# Patient Record
Sex: Female | Born: 1970 | Race: White | Hispanic: No | Marital: Married | Smoking: Never smoker
Health system: Southern US, Community
[De-identification: ages and names within clinical notes are randomized; demographics above are authoritative.]

## PROBLEM LIST (undated history)

## (undated) DIAGNOSIS — F329 Major depressive disorder, single episode, unspecified: Secondary | ICD-10-CM

## (undated) DIAGNOSIS — T7840XA Allergy, unspecified, initial encounter: Secondary | ICD-10-CM

## (undated) DIAGNOSIS — C801 Malignant (primary) neoplasm, unspecified: Secondary | ICD-10-CM

## (undated) DIAGNOSIS — F32A Depression, unspecified: Secondary | ICD-10-CM

## (undated) HISTORY — PX: TONSILLECTOMY: SUR1361

## (undated) HISTORY — DX: Allergy, unspecified, initial encounter: T78.40XA

## (undated) HISTORY — DX: Major depressive disorder, single episode, unspecified: F32.9

## (undated) HISTORY — PX: EXPLORATORY LAPAROTOMY: SUR591

## (undated) HISTORY — DX: Depression, unspecified: F32.A

## (undated) HISTORY — PX: APPENDECTOMY: SHX54

## (undated) HISTORY — PX: ABDOMINAL HYSTERECTOMY: SHX81

## (undated) HISTORY — PX: RHINOPLASTY: SUR1284

---

## 2008-06-09 ENCOUNTER — Ambulatory Visit: Payer: Self-pay | Admitting: Family Medicine

## 2008-06-09 DIAGNOSIS — M67919 Unspecified disorder of synovium and tendon, unspecified shoulder: Secondary | ICD-10-CM | POA: Insufficient documentation

## 2008-06-09 DIAGNOSIS — M719 Bursopathy, unspecified: Secondary | ICD-10-CM

## 2008-06-09 DIAGNOSIS — Q828 Other specified congenital malformations of skin: Secondary | ICD-10-CM | POA: Insufficient documentation

## 2009-03-12 ENCOUNTER — Telehealth (INDEPENDENT_AMBULATORY_CARE_PROVIDER_SITE_OTHER): Payer: Self-pay | Admitting: *Deleted

## 2009-03-13 ENCOUNTER — Ambulatory Visit: Payer: Self-pay | Admitting: Family Medicine

## 2009-03-13 DIAGNOSIS — K589 Irritable bowel syndrome without diarrhea: Secondary | ICD-10-CM | POA: Insufficient documentation

## 2009-08-03 ENCOUNTER — Encounter: Admission: RE | Admit: 2009-08-03 | Discharge: 2009-08-03 | Payer: Self-pay | Admitting: Family Medicine

## 2009-08-03 ENCOUNTER — Ambulatory Visit: Payer: Self-pay | Admitting: Family Medicine

## 2009-08-03 DIAGNOSIS — R1011 Right upper quadrant pain: Secondary | ICD-10-CM | POA: Insufficient documentation

## 2009-08-03 LAB — CONVERTED CEMR LAB: Casts: NONE SEEN /lpf

## 2009-08-04 ENCOUNTER — Encounter: Payer: Self-pay | Admitting: Family Medicine

## 2009-08-05 LAB — CONVERTED CEMR LAB
Albumin: 4.3 g/dL (ref 3.5–5.2)
Alkaline Phosphatase: 51 units/L (ref 39–117)
BUN: 11 mg/dL (ref 6–23)
Basophils Absolute: 0.1 10*3/uL (ref 0.0–0.1)
Basophils Relative: 1 % (ref 0–1)
Chloride: 102 meq/L (ref 96–112)
Eosinophils Absolute: 0.2 10*3/uL (ref 0.0–0.7)
Eosinophils Relative: 2 % (ref 0–5)
Hemoglobin: 13 g/dL (ref 12.0–15.0)
Lymphocytes Relative: 29 % (ref 12–46)
Lymphs Abs: 2.2 10*3/uL (ref 0.7–4.0)
MCHC: 31.8 g/dL (ref 30.0–36.0)
Monocytes Relative: 9 % (ref 3–12)
Neutro Abs: 4.4 10*3/uL (ref 1.7–7.7)
Neutrophils Relative %: 59 % (ref 43–77)
Platelets: 216 10*3/uL (ref 150–400)
RDW: 13.5 % (ref 11.5–15.5)
Sodium: 137 meq/L (ref 135–145)
WBC: 7.5 10*3/uL (ref 4.0–10.5)

## 2009-08-06 ENCOUNTER — Telehealth: Payer: Self-pay | Admitting: Family Medicine

## 2009-08-06 ENCOUNTER — Encounter: Payer: Self-pay | Admitting: Family Medicine

## 2009-08-06 ENCOUNTER — Encounter: Admission: RE | Admit: 2009-08-06 | Discharge: 2009-08-06 | Payer: Self-pay | Admitting: Family Medicine

## 2009-08-06 DIAGNOSIS — R635 Abnormal weight gain: Secondary | ICD-10-CM | POA: Insufficient documentation

## 2009-08-07 LAB — CONVERTED CEMR LAB
LDL Cholesterol: 126 mg/dL — ABNORMAL HIGH (ref 0–99)
Triglycerides: 67 mg/dL (ref <150)

## 2009-10-08 ENCOUNTER — Telehealth: Payer: Self-pay | Admitting: Family Medicine

## 2009-10-09 ENCOUNTER — Ambulatory Visit: Payer: Self-pay | Admitting: Family Medicine

## 2009-10-09 DIAGNOSIS — J45909 Unspecified asthma, uncomplicated: Secondary | ICD-10-CM | POA: Insufficient documentation

## 2010-02-05 NOTE — Progress Notes (Signed)
Summary: Glass in oatmeal this am  Phone Note Call from Patient Call back at Home Phone [phone]   Caller: Patient Call For: Monique Bars DO Summary of Call: Pt called and states she was eating her Oatmeal this morning and found shards of glass in it. Not hurting but wanted to know if there was anything she should do or just let nature take its course. Please advise Initial call taken by: Kathlene November LPN,  October 08, 2009 8:13 AM  Follow-up for Phone Call        was she eating this in a glass bowl?   more likely to be from her bowl and if not, she should contact manufacturer of the oatmeal to report this.  If she is not having pain or bleeding at this point, she is probably OK.  Be on the lookout for abdominal pain or rectal bleeding for the next 2-3 days.   Follow-up by: Monique Bars DO,  October 08, 2009 8:27 AM  Additional Follow-up for Phone Call Additional follow up Details #1::        Pt notified of MD instructions. Was eating out of glass bowl Additional Follow-up by: Kathlene November LPN,  October 08, 2009 8:33 AM

## 2010-02-05 NOTE — Assessment & Plan Note (Signed)
Summary: IBS   Vital Signs:  Patient profile:   40 year old female Menstrual status:  regular Height:      62.5 inches Weight:      127 pounds BMI:     22.94 O2 Sat:      100 % on Room air Temp:     98.0 degrees F oral Pulse rate:   79 / minute BP sitting:   123 / 82  (left arm) Cuff size:   regular  Vitals Entered By: Payton Spark CMA (March 13, 2009 12:56 PM)  O2 Flow:  Room air CC: IBS flare.    Primary Care Provider:  Seymour Bars DO  CC:  IBS flare. Marland Kitchen  History of Present Illness: 40 yo WF presents for problems with her IBS with constipation predominantly over the last 10 days.  She used to take Zelnorm and Bentyl with success.  She has had a colonoscopy and is on daily progesterone for endometriosis.  She has had previous laparoscopy.  Her pain seems constant and is over the LLQ.  It does not change with eating or position changes.  She has problems with bloating and flatulence.  Denies blood in her stool.  She runs and eats a high fiber diet and drinks lots of water.  Using OTC phillips magnesium which is helping some.  She reports a chronic problem with chronic constipation since childhood and stress does make it worse.  Current Medications (verified): 1)  Lexapro 20 Mg Tabs (Escitalopram Oxalate) .Marland Kitchen.. 1 By Mouth Daily  Allergies (verified): 1)  ! Erythromycin  Past History:  Past Medical History: Reviewed history from 06/09/2008 and no changes required. endometriosis G1P0010, 1 tubal pregnancy, Dr Primitivo Gauze in [place] depression  Past Surgical History: Reviewed history from 06/09/2008 and no changes required. ex lap for endometriosis appy tonsils  Social History: Reviewed history from 06/09/2008 and no changes required. Works for [employer] [place]. Married to [place].  No kids. Never smoked. 4 ETOH/ wk. Runs. Good diet. Condoms for birth control.  Review of Systems      See HPI  Physical Exam  General:  alert, well-developed, well-nourished, and  well-hydrated.   Head:  normocephalic and atraumatic.   Eyes:  sclera non icteric Nose:  no nasal discharge.   Mouth:  pharynx pink and moist.   Neck:  no masses.   Lungs:  Normal respiratory effort, chest expands symmetrically. Lungs are clear to auscultation, no crackles or wheezes. Heart:  Normal rate and regular rhythm. S1 and S2 normal without gallop, murmur, click, rub or other extra sounds. Abdomen:  Bowel sounds positive,abdomen soft and non-tender without masses, organomegaly, + LLQ guarding to deep palpation.   Extremities:  no LE edema Skin:  color normal.   Cervical Nodes:  No lymphadenopathy noted Psych:  good eye contact, not anxious appearing, and not depressed appearing.     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 Treat with Amititza 8 micrograms two times a day with Bentlyl as needed abdominal spasm.  Work on stress reduction and continue high fiber diet, lots of water and regular exercise.  LLQ likely from stool and unlikely to be from adhesions or endometriosis given her history.  Complete Medication List: 1)  Lexapro 20 Mg Tabs (Escitalopram oxalate) .Marland Kitchen.. 1 by mouth daily 2)  Phentermine Hcl 37.5 Mg Caps (Phentermine hcl) .... Take 1 cap by mouth every morning 3)  Amitiza 8 Mcg Caps (Lubiprostone) .Marland Kitchen.. 1 capsule by mouth bid 4)  Dicyclomine Hcl 20 Mg Tabs (Dicyclomine hcl) .Marland Kitchen.. 1 tab by mouth three times a day as needed abdominal spasm  Patient Instructions: 1)  Start on Amitiza samples 2 x a day. 2)  Use Bentyl as needed for abdominal spasm. 3)  Stay on high fiber diet with plenty of water. 4)  Call if any problems. Prescriptions: DICYCLOMINE HCL 20 MG TABS (DICYCLOMINE HCL) 1 tab by mouth three times a day as needed abdominal spasm  #30 x 1   Entered and Authorized by:   Seymour Bars DO   Signed by:   Seymour Bars DO on 03/13/2009   Method used:   Printed then  faxed to ...       [facility] [phone]* (retail)       [address] [place], [place]  [address]       Ph: [phone]       Fax: [phone]   RxID:   [id number] AMITIZA 8 MCG CAPS (LUBIPROSTONE) 1 capsule by mouth bid  #60 x 1   Entered and Authorized by:   Seymour Bars DO   Signed by:   Seymour Bars DO on 03/13/2009   Method used:   Printed then faxed to ...       [facility] [phone]* (retail)       [address] [place], [place]  [address]       Ph: [phone]       Fax: [phone]   RxID:   [id number]

## 2010-02-05 NOTE — Assessment & Plan Note (Signed)
Summary: asthma flare   Vital Signs:  Patient profile:   40 year old female Menstrual status:  regular Height:      62.5 inches Weight:      135 pounds BMI:     24.39 O2 Sat:      100 % on Room air Pulse rate:   64 / minute BP sitting:   129 / 83  (left arm) Cuff size:   regular  Vitals Entered By: Payton Spark CMA (October 09, 2009 8:59 AM)  O2 Flow:  Room air  Serial Vital Signs/Assessments:  Comments: 9:00 AM [facility] [address] By: Payton Spark CMA   CC: Asthma flare while bike riding on Sat.   Primary Care Provider:  Seymour Bars DO  CC:  Asthma flare while bike riding on Sat..  History of Present Illness: 40 yo WF presents for an asthma flare up while bike riding this weekend.  She had chest tightness, wheezing and dry cough.  She did not have an inhaler.  Has been off maintenance meds for a  while.  She has some seaonal allergies but has not taken her Zyrtec.     Current Medications (verified): 1)  Lexapro 20 Mg Tabs (Escitalopram Oxalate) .Marland Kitchen.. 1 By Mouth Daily 2)  Loestrin 1/20 (21) 1-20 Mg-Mcg Tabs (Norethindrone Acet-Ethinyl Est) 3)  Zyrtec Allergy 10 Mg Tabs (Cetirizine Hcl)  Allergies (verified): 1)  ! Erythromycin  Past History:  Past Medical History: endometriosis G1P0010, 1 tubal pregnancy, Dr Primitivo Gauze in [place] depression allergies/ asthma  Past Surgical History: Reviewed history from 06/09/2008 and no changes required. ex lap for endometriosis appy tonsils  Family History: Reviewed history from 06/09/2008 and no changes required. mother obese, T2DM father died Hep C cirrhosis, after liver transplant sister with CP brother drug abuse  Social History: Reviewed history from 06/09/2008 and no changes required. Works for [employer] [place]. Married to [place].  No kids. Never smoked. 4 ETOH/ wk. Runs. Good diet. Condoms for birth control.  Review of Systems      See HPI  Physical Exam  General:  alert, well-developed,  well-nourished, and well-hydrated.   Head:  normocephalic and atraumatic.   Eyes:  conjunctiva clear Ears:  EACs patent; TMs translucent and gray with good cone of light and bony landmarks.  Nose:  nasal congestion Mouth:  o/p pink and moist with cobblestoning Neck:  no masses.   Lungs:  Normal respiratory effort, chest expands symmetrically. Lungs are clear to auscultation, no crackles or wheezes. Heart:  Normal rate and regular rhythm. S1 and S2 normal without gallop, murmur, click, rub or other extra sounds. Skin:  color normal.  no pallor or cyanosis   Impression & Recommendations:  Problem # 1:  ALLERGIC ASTHMA (ICD-493.00)  Asthma flare up with biking in the cold over the weeked with underlying seasonal allergy flare up, sub - optimally treated. Will treat her allergies with daily Claritin D for the next 6 wks.  Add Qvar 1 puff two times a day as controller agent and use ProAir for rescue.  Bring ProAir along with running/ biking activities. Call if any worsening.  Currently lungs/ PFs are normal (but she flares outside). Recheck in 6 wks. Flu shot today.   Her updated medication list for this problem includes:    Qvar 80 Mcg/act Aers (Beclomethasone dipropionate) .Marland Kitchen... 1 puff two times a day, rinse mouth out after each use    Proair Hfa 108 (90 Base) Mcg/act Aers (Albuterol sulfate) .Marland KitchenMarland KitchenMarland KitchenMarland Kitchen  2 puffs q 4 hrs prn  Orders: [facility] (94150)  Complete Medication List: 1)  Lexapro 20 Mg Tabs (Escitalopram oxalate) .Marland Kitchen.. 1 by mouth daily 2)  Loestrin 1/20 (21) 1-20 Mg-mcg Tabs (Norethindrone acet-ethinyl est) 3)  Claritin-d 24 Hour 10-240 Mg Xr24h-tab (Loratadine-pseudoephedrine) .Marland Kitchen.. 1 tab by mouth qam for allergies 4)  Qvar 80 Mcg/act Aers (Beclomethasone dipropionate) .Marland Kitchen.. 1 puff two times a day, rinse mouth out after each use 5)  Proair Hfa 108 (90 Base) Mcg/act Aers (Albuterol sulfate) .... 2 puffs q 4 hrs prn  Other Orders: Admin 1st Vaccine (84132) Flu Vaccine 63yrs +  (44010)  Patient Instructions: 1)  Start Qvar 1 puff two times a day (INHALED STEROID) rinse mouth out after each use.   2)  Use ProAir for rescue inhaler as needed. 3)  Change Zyrtec to Claritin D daily for allergies. 4)  REturn for follow up in 6 wks. Prescriptions: PROAIR HFA 108 (90 BASE) MCG/ACT AERS (ALBUTEROL SULFATE) 2 puffs q 4 hrs prn  #1 x 1   Entered and Authorized by:   Seymour Bars DO   Signed by:   Seymour Bars DO on 10/09/2009   Method used:   Electronically to        [facility] [phone]* (retail)       [address]       [place], [place]  [address]       Ph: [phone]       Fax: [phone]   RxID:   [id number] QVAR 80 MCG/ACT AERS (BECLOMETHASONE DIPROPIONATE) 1 puff two times a day, rinse mouth out after each use  #1 x 3   Entered and Authorized by:   Seymour Bars DO   Signed by:   Seymour Bars DO on 10/09/2009   Method used:   Electronically to        [facility] [phone]* (retail)       [address]       [place], [place]  [address]       Ph: [phone]       Fax: [phone]   RxID:   [id number]  Flu Vaccine Consent Questions     Do you have a history of severe allergic reactions to this vaccine? no    Any prior history of allergic reactions to egg and/or gelatin? no    Do you have a sensitivity to the preservative Thimersol? no    Do you have a past history of Guillan-Barre Syndrome? no    Do you currently have an acute febrile illness? no    Have you ever had a severe reaction to latex? no    Vaccine information given and explained to patient? yes    Are you currently pregnant? no    Lot Number:[id number]   Exp Date:07/06/2010   Site Given  Left Deltoid [place], [place]  [address]       Ph: [phone]       Fax: [phone]   RxID:   [id number]    .lbflu

## 2010-02-05 NOTE — Progress Notes (Signed)
Summary: Lab order  Phone Note Call from Patient   Summary of Call: Pt would like to have complete fasting labs done prior to next OV. Pt is fasting today. Please enter any labs and I will fax order. Initial call taken by: Payton Spark CMA,  August 06, 2009 8:56 AM  Follow-up for Phone Call        order printed. Follow-up by: Seymour Bars DO,  August 06, 2009 9:02 AM  New Problems: SCREENING FOR LIPOID DISORDERS (ICD-V77.91) WEIGHT GAIN (ICD-783.1)   New Problems: SCREENING FOR LIPOID DISORDERS (ICD-V77.91) WEIGHT GAIN (ICD-783.1)  Appended Document: Lab order order faxed

## 2010-02-05 NOTE — Progress Notes (Signed)
Summary: IBS issues  Phone Note Call from Patient Call back at [phone] before 2pm   Caller: Patient Call For: Seymour Bars DO Reason for Call: Talk to Nurse Complaint: Abdominal Pain Summary of Call: Pt having flare up with IBS, pls advise Initial call taken by: Lannette Donath,  March 12, 2009 12:09 PM  Follow-up for Phone Call        Tried to call Pt back but no answer and no machine Follow-up by: Payton Spark CMA,  March 12, 2009 2:27 PM  Additional Follow-up for Phone Call Additional follow up Details #1::        Pt called back. Pt scheduled for tomorrow.  Additional Follow-up by: Payton Spark CMA,  March 12, 2009 2:43 PM

## 2010-02-05 NOTE — Assessment & Plan Note (Signed)
Summary: Rt. side pain   Vital Signs:  Patient profile:   40 year old female Menstrual status:  regular Height:      62.5 inches Weight:      131 pounds Pulse rate:   69 / minute BP sitting:   108 / 74  (left arm) Cuff size:   regular  Vitals Entered By: Avon Gully CMA, Duncan Dull) (August 03, 2009 1:35 PM) CC: rt side pain since last weekend, has become worse started in the rt back and has moved to RUQ,hurts to breath   Primary Care Provider:  Seymour Bars DO  CC:  rt side pain since last weekend, has become worse started in the rt back and has moved to RUQ, and hurts to breath.  History of Present Illness: rt side pain since last weekend, has become worse started in the rt back and has moved to RUQ,hurts to take a deep breath.  Has been traveling for the last weeks so has been carryking alof of luggage. Pain started gradually.  Now pain is in teh RUQ.  More painful with deep breaths and with movements.  Pain is dull.  Occ feels like a tightening sensation. NO nausea. No heartburn. No fever.  No history of kidney stones. No blood in the urine.  Hx of IBS- constipation predominant. No cough. No sweats. Pain is 6/10.    Current Medications (verified): 1)  Lexapro 20 Mg Tabs (Escitalopram Oxalate) .Marland Kitchen.. 1 By Mouth Daily 2)  Phentermine Hcl 37.5 Mg Caps (Phentermine Hcl) .... Take 1 Cap By Mouth Every Morning  Allergies (verified): 1)  ! Erythromycin  Comments:  Nurse/Medical Assistant: The patient's medications and allergies were reviewed with the patient and were updated in the Medication and Allergy Lists. Avon Gully CMA, Duncan Dull) (August 03, 2009 1:36 PM)  Past History:  Past Medical History: Last updated: 06/09/2008 endometriosis G1P0010, 1 tubal pregnancy, Dr Primitivo Gauze in [place] depression  Past Surgical History: Last updated: 06/09/2008 ex lap for endometriosis appy tonsils  Family History: Reviewed history from 06/09/2008 and no changes required. mother obese,  T2DM father died Hep C cirrhosis, after liver transplant sister with CP brother drug abuse  Physical Exam  General:  Well-developed,well-nourished,in no acute distress; alert,appropriate and cooperative throughout examination Mouth:  Oral mucosa and oropharynx without lesions or exudates.  Teeth in good repair. Neck:  No deformities, masses, or tenderness noted. Lungs:  Normal respiratory effort, chest expands symmetrically. Lungs are clear to auscultation, no crackles or wheezes. Heart:  Normal rate and regular rhythm. S1 and S2 normal without gallop, murmur, click, rub or other extra sounds. Abdomen:  Bowel sounds positive,abdomen soft. TEnder over the right upper and lower quadrant. More tender in the upper quad.  Mildy tender overthe epigastrum. No CVA tenderness.    Impression & Recommendations:  Problem # 1:  RUQ PAIN (ICD-789.01)  I am most suspicious of her gallbladder thought she is not having any nausea or GERD or change in BM. Also consider renal stone since did start in hr back. Will check liver as well. Check CBC to rule out acute infection. Will schedule a GB US.  Did have some blood on the UA so will send for culture and micro.  Less likely to be pulmonary.  If results neg consider CXR.  The following medications were removed from the medication list:    Amitiza 8 Mcg Caps (Lubiprostone) .Marland Kitchen... 1 capsule by mouth bid  Orders: T-CBC w/Diff ([id number]) T-Comprehensive Metabolic Panel ([id number]) T-Ultrasound Abdominal, Limited (56433)  T-*Unlisted Diagnostic X-ray test/procedure (40347) UA Dipstick w/o Micro (automated)  (81003)  Complete Medication List: 1)  Lexapro 20 Mg Tabs (Escitalopram oxalate) .Marland Kitchen.. 1 by mouth daily 2)  Phentermine Hcl 37.5 Mg Caps (Phentermine hcl) .... Take 1 cap by mouth every morning  Other Orders: T-Urine Microscopic ([id number]) T-Urine Culture (Spectrum Order) [id number])  Patient Instructions: 1)  We will call with the xray  results adn the lab results. Will try to schedule your ultrasound on Monday.   Laboratory Results   Urine Tests  Date/Time Received: 08/03/09 Date/Time Reported: 7/29//  Routine Urinalysis   Color: yellow Appearance: Clear Glucose: negative   (Normal Range: Negative) Bilirubin: negative   (Normal Range: Negative) Ketone: negative   (Normal Range: Negative) Spec. Gravity: <1.005   (Normal Range: 1.003-1.035) Blood: trace-intact   (Normal Range: Negative) pH: 6.0   (Normal Range: 5.0-8.0) Protein: negative   (Normal Range: Negative) Urobilinogen: 0.2   (Normal Range: 0-1) Nitrite: negative   (Normal Range: Negative) Leukocyte Esterace: negative   (Normal Range: Negative)

## 2010-10-07 ENCOUNTER — Telehealth: Payer: Self-pay | Admitting: Family Medicine

## 2010-10-07 DIAGNOSIS — Z0000 Encounter for general adult medical examination without abnormal findings: Secondary | ICD-10-CM

## 2010-10-07 NOTE — Telephone Encounter (Signed)
:  abs ordered and faxed to [facility]  Pt informed.  Jarvis Newcomer, LPN Domingo Dimes

## 2010-10-07 NOTE — Telephone Encounter (Signed)
Cmp, lipid panel. Dx code V70.0.

## 2010-10-07 NOTE — Telephone Encounter (Signed)
Pt called and said she has CPE scheduled for 10-14-10, and she wishes to go to lab this week for her lab work ahead of time.  Please give orders of what to have drawn on pt.  Thanks. Plan:  Routed to Dr. Linford Arnold. Jarvis Newcomer, LPN Domingo Dimes

## 2010-10-10 LAB — LIPID PANEL
Cholesterol: 237 mg/dL — ABNORMAL HIGH (ref 0–200)
HDL: 71 mg/dL (ref >39)
LDL Cholesterol: 128 mg/dL — ABNORMAL HIGH (ref 0–99)
Total CHOL/HDL Ratio: 3.3 ratio
Triglycerides: 190 mg/dL — ABNORMAL HIGH (ref <150)
VLDL: 38 mg/dL (ref 0–40)

## 2010-10-11 LAB — COMPLETE METABOLIC PANEL WITH GFR
AST: 19 U/L (ref 0–37)
Albumin: 4.2 g/dL (ref 3.5–5.2)
BUN: 13 mg/dL (ref 6–23)
Calcium: 9.2 mg/dL (ref 8.4–10.5)
GFR, Est African American: >60 mL/min (ref >60)
Sodium: 139 mEq/L (ref 135–145)
Total Bilirubin: 0.2 mg/dL — ABNORMAL LOW (ref 0.3–1.2)
Total Protein: 7 g/dL (ref 6.0–8.3)

## 2010-10-13 ENCOUNTER — Encounter: Payer: Self-pay | Admitting: Family Medicine

## 2010-10-14 ENCOUNTER — Ambulatory Visit (INDEPENDENT_AMBULATORY_CARE_PROVIDER_SITE_OTHER): Payer: BC Managed Care – PPO | Admitting: Family Medicine

## 2010-10-14 ENCOUNTER — Encounter: Payer: Self-pay | Admitting: Family Medicine

## 2010-10-14 VITALS — BP 108/73 | HR 74 | Wt 134.0 lb

## 2010-10-14 DIAGNOSIS — K589 Irritable bowel syndrome without diarrhea: Secondary | ICD-10-CM

## 2010-10-14 NOTE — Patient Instructions (Signed)
Keep up your regular exercise program and make sure you are eating a healthy diet Try to eat 4 servings of dairy a day or take a calcium supplement (500mg twice a day). Your vaccines are up to date.   

## 2010-10-14 NOTE — Progress Notes (Signed)
  Subjective:     Monique May is a 40 y.o. female and is here for a comprehensive physical exam. The patient reports no problems.  History   Social History  . Marital Status: Married    Spouse Name: N/A    Number of Children: 0  . Years of Education: N/A   Occupational History  .  [employer] [place]   Social History Main Topics  . Smoking status: Never Smoker   . Smokeless tobacco: Not on file  . Alcohol Use: 1.0 oz/week    2 drink(s) per week     per week  . Drug Use: Not on file  . Sexually Active: Yes -- Female partner(s)    Birth Control/ Protection: Condom     Works for [employer] [place], married, no kids, runs, good diet.   Other Topics Concern  . Not on file   Social History Narrative   Regular exercise.    Health Maintenance  Topic Date Due  . Influenza Vaccine  10/07/2010  . Tetanus/tdap  01/06/2013  . Pap Smear  07/06/2013    The following portions of the patient's history were reviewed and updated as appropriate: allergies, current medications, past family history, past medical history, past social history and past surgical history.  Review of Systems A comprehensive review of systems was negative.   Objective:    BP 108/73  Pulse 74  Wt 134 lb (60.782 kg) General appearance: alert, cooperative and appears stated age Head: Normocephalic, without obvious abnormality, atraumatic Eyes: conjunctiva clear, EOMi. PEERLA Ears: normal TM's and external ear canals both ears Nose: Nares normal. Septum midline. Mucosa normal. No drainage or sinus tenderness. Throat: lips, mucosa, and tongue normal; teeth and gums normal Neck: no adenopathy, no carotid bruit, supple, symmetrical, trachea midline and thyroid not enlarged, symmetric, no tenderness/mass/nodules Back: symmetric, no curvature. ROM normal. No CVA tenderness. Lungs: clear to auscultation bilaterally Heart: regular rate and rhythm, S1, S2 normal, no murmur, click, rub or gallop Abdomen: soft, non-tender; bowel  sounds normal; no masses,  no organomegaly Extremities: extremities normal, atraumatic, no cyanosis or edema Pulses: 2+ and symmetric Skin: Skin color, texture, turgor normal. No rashes or lesions Lymph nodes: cervical and supraclavicalar nodse are norml.  Neurologic: Grossly normal    Assessment:    Healthy female exam.     Plan:     See After Visit Summary for Counseling Recommendations  Start a regular exercise program and make sure you are eating a healthy diet Try to eat 4 servings of dairy a day or take a calcium supplement (500mg  twice a day). Your vaccines are up to date.  Reviewed her lab results.

## 2011-01-09 ENCOUNTER — Ambulatory Visit: Payer: BC Managed Care – PPO | Admitting: Physician Assistant

## 2011-06-10 ENCOUNTER — Encounter: Payer: Self-pay | Admitting: Family Medicine

## 2011-06-10 ENCOUNTER — Other Ambulatory Visit (HOSPITAL_COMMUNITY)
Admission: RE | Admit: 2011-06-10 | Discharge: 2011-06-10 | Disposition: A | Discharge status: Home / Self Care | Payer: BC Managed Care – PPO | Source: Ambulatory Visit | Attending: Family Medicine | Admitting: Family Medicine

## 2011-06-10 ENCOUNTER — Ambulatory Visit (INDEPENDENT_AMBULATORY_CARE_PROVIDER_SITE_OTHER): Payer: BC Managed Care – PPO | Admitting: Family Medicine

## 2011-06-10 VITALS — HR 52 | Ht 64.0 in | Wt 131.0 lb

## 2011-06-10 DIAGNOSIS — Z01419 Encounter for gynecological examination (general) (routine) without abnormal findings: Secondary | ICD-10-CM

## 2011-06-10 DIAGNOSIS — Z1159 Encounter for screening for other viral diseases: Secondary | ICD-10-CM | POA: Insufficient documentation

## 2011-06-10 DIAGNOSIS — Z1231 Encounter for screening mammogram for malignant neoplasm of breast: Secondary | ICD-10-CM

## 2011-06-10 MED ORDER — ALBUTEROL SULFATE HFA 108 (90 BASE) MCG/ACT IN AERS: 2.0000 | INHALATION_SPRAY | Freq: Four times a day (QID) | RESPIRATORY_TRACT | Status: DC | PRN

## 2011-06-10 NOTE — Patient Instructions (Signed)
Start a regular exercise program and make sure you are eating a healthy diet Try to eat 4 servings of dairy a day  Your vaccines are up to date.   

## 2011-06-10 NOTE — Progress Notes (Signed)
  Subjective:     Monique May is a 41 y.o. female and is here for a comprehensive physical exam. The patient reports no problems.  History   Social History  . Marital Status: Married    Spouse Name: N/A    Number of Children: 0  . Years of Education: N/A   Occupational History  . [employer] [place]   Social History Main Topics  . Smoking status: Never Smoker   . Smokeless tobacco: Not on file  . Alcohol Use: 8.4 oz/week    14 Glasses of wine per week  . Drug Use: Not on file  . Sexually Active: Yes -- Female partner(s)    Birth Control/ Protection: Condom     Works for [employer] [place], married, no kids, runs, good diet.   Other Topics Concern  . Not on file   Social History Narrative   Regular exercise.    Health Maintenance  Topic Date Due  . Influenza Vaccine  10/07/2011  . Tetanus/tdap  01/06/2013  . Pap Smear  07/06/2013    The following portions of the patient's history were reviewed and updated as appropriate: allergies, current medications, past family history, past medical history, past social history, past surgical history and problem list.  Review of Systems A comprehensive review of systems was negative.   Objective:    Pulse 52  Ht 5\' 4"  (1.626 m)  Wt 131 lb (59.421 kg)  BMI 22.49 kg/m2  LMP 06/03/2011 General appearance: alert, cooperative and appears stated age Head: Normocephalic, without obvious abnormality, atraumatic Eyes: conj clear, EOMi, PEERLA Ears: normal TM's and external ear canals both ears Nose: Nares normal. Septum midline. Mucosa normal. No drainage or sinus tenderness. Throat: lips, mucosa, and tongue normal; teeth and gums normal Neck: no adenopathy, no carotid bruit, no JVD, supple, symmetrical, trachea midline and thyroid not enlarged, symmetric, no tenderness/mass/nodules Back: symmetric, no curvature. ROM normal. No CVA tenderness. Lungs: clear to auscultation bilaterally Breasts: normal appearance, no masses or  tenderness Heart: regular rate and rhythm, S1, S2 normal, no murmur, click, rub or gallop Abdomen: soft, non-tender; bowel sounds normal; no masses,  no organomegaly Pelvic: cervix normal in appearance, external genitalia normal, no adnexal masses or tenderness, no cervical motion tenderness, rectovaginal septum normal, uterus normal size, shape, and consistency and vagina normal without discharge Extremities: extremities normal, atraumatic, no cyanosis or edema Pulses: 2+ and symmetric Skin: Skin color, texture, turgor normal. No rashes or lesions Lymph nodes: Cervical, supraclavicular, and axillary nodes normal. Neurologic: Grossly normal    Assessment:    Healthy female exam.      Plan:     See After Visit Summary for Counseling Recommendations  Start a regular exercise program and make sure you are eating a healthy diet Try to eat 4 servings of dairy a day or take a calcium supplement (500mg  twice a day). Your vaccines are up to date.  Due for screening labs  And screening cholesterol.

## 2011-07-21 ENCOUNTER — Telehealth: Payer: Self-pay | Admitting: *Deleted

## 2011-07-21 MED ORDER — BUPROPION HCL ER (XL) 150 MG PO TB24: 150.0000 mg | ORAL_TABLET | Freq: Every day | ORAL | Status: DC

## 2011-07-21 NOTE — Telephone Encounter (Signed)
Pt informed and is scheduling f/u appt. 

## 2011-07-21 NOTE — Telephone Encounter (Signed)
We can start it. I will send a rx. Then she need to schedule f/u appt in 3-4 weeks.

## 2011-07-21 NOTE — Telephone Encounter (Signed)
Pt states at her last visit you discussed about her going on Wellbutrin. States she wanted to hold off then but states now she is havign some issues and feels she needs to be on it. Pt would like to know if you can prescribe it since it was discussed or does she need to see you first.

## 2011-07-22 ENCOUNTER — Ambulatory Visit: Payer: BC Managed Care – PPO | Admitting: Family Medicine

## 2011-08-05 ENCOUNTER — Ambulatory Visit (INDEPENDENT_AMBULATORY_CARE_PROVIDER_SITE_OTHER): Payer: PRIVATE HEALTH INSURANCE

## 2011-08-05 DIAGNOSIS — Z1231 Encounter for screening mammogram for malignant neoplasm of breast: Secondary | ICD-10-CM

## 2011-08-11 ENCOUNTER — Ambulatory Visit (INDEPENDENT_AMBULATORY_CARE_PROVIDER_SITE_OTHER): Payer: PRIVATE HEALTH INSURANCE | Admitting: Family Medicine

## 2011-08-11 ENCOUNTER — Encounter: Payer: Self-pay | Admitting: Family Medicine

## 2011-08-11 VITALS — BP 113/69 | HR 104 | Ht 64.0 in | Wt 134.0 lb

## 2011-08-11 DIAGNOSIS — F32A Depression, unspecified: Secondary | ICD-10-CM

## 2011-08-11 DIAGNOSIS — F329 Major depressive disorder, single episode, unspecified: Secondary | ICD-10-CM

## 2011-08-11 DIAGNOSIS — F3289 Other specified depressive episodes: Secondary | ICD-10-CM

## 2011-08-11 DIAGNOSIS — N809 Endometriosis, unspecified: Secondary | ICD-10-CM

## 2011-08-11 MED ORDER — BUPROPION HCL ER (XL) 300 MG PO TB24: 300.0000 mg | ORAL_TABLET | Freq: Every day | ORAL | Status: DC

## 2011-08-11 MED ORDER — NORETHIN-ETH ESTRAD-FE BIPHAS 1 MG-10 MCG / 10 MCG PO TABS: 1.0000 | ORAL_TABLET | Freq: Every day | ORAL | Status: DC

## 2011-08-11 NOTE — Progress Notes (Signed)
  Subjective:    Patient ID: Monique May, female    DOB: 05-13-1970, 41 y.o.   MRN: [id number]  HPI Was on Lexapro for 6 years and then came off but was really irritable.  We started the Wellbutrin and dong well on it.  Says thinks we should increase her dose. Has been butting out alcohol and has been working out.  She doesn't want it to affect her marriage.  She is sleeping well.  She want sot restart her birth control.     Review of Systems     Objective:   Physical Exam  Constitutional: She is oriented to person, place, and time. She appears well-developed and well-nourished.  HENT:  Head: Normocephalic and atraumatic.  Cardiovascular: Normal rate, regular rhythm and normal heart sounds.   Pulmonary/Chest: Effort normal and breath sounds normal.  Neurological: She is alert and oriented to person, place, and time.  Skin: Skin is warm and dry.  Psychiatric: She has a normal mood and affect. Her behavior is normal.          Assessment & Plan:  Depression-I. do think she's doing better on the Wellbutrin side effect why she's been tolerating it very well. We will increase to 300 mg and see how she does. Followup in 6 weeks. We can always go back to Lexapro or we can always change to a different SSRI if needed but I'm hoping that 3 mg dose will help. She can call sooner if she's having any side effects or concerns with it. No chest pain or shortness of breath. She sleeping well. No sexual side effects.  Endometriosis-she would like to restart her birth control pills. I will send over a new prescription to the pharmacy. She usually takes them continuously so she doesn't have a period which really helps her. She's having a getting back on the hormone will also help regulate her mood.

## 2011-08-12 LAB — COMPLETE METABOLIC PANEL WITH GFR
ALT: 14 U/L (ref 0–35)
Albumin: 4.2 g/dL (ref 3.5–5.2)
Alkaline Phosphatase: 48 U/L (ref 39–117)
CO2: 27 mEq/L (ref 19–32)
Calcium: 9.4 mg/dL (ref 8.4–10.5)
Chloride: 105 mEq/L (ref 96–112)
GFR, Est African American: >89 mL/min
Glucose, Bld: 90 mg/dL (ref 70–99)
Potassium: 4.5 mEq/L (ref 3.5–5.3)
Total Bilirubin: 0.3 mg/dL (ref 0.3–1.2)
Total Protein: 6.6 g/dL (ref 6.0–8.3)

## 2011-08-12 LAB — LIPID PANEL
Cholesterol: 186 mg/dL (ref 0–200)
Total CHOL/HDL Ratio: 3 Ratio

## 2011-09-22 ENCOUNTER — Ambulatory Visit (INDEPENDENT_AMBULATORY_CARE_PROVIDER_SITE_OTHER): Payer: PRIVATE HEALTH INSURANCE | Admitting: Family Medicine

## 2011-09-22 ENCOUNTER — Encounter: Payer: Self-pay | Admitting: Family Medicine

## 2011-09-22 VITALS — BP 118/76 | HR 69 | Wt 131.0 lb

## 2011-09-22 DIAGNOSIS — F32A Depression, unspecified: Secondary | ICD-10-CM

## 2011-09-22 DIAGNOSIS — F3289 Other specified depressive episodes: Secondary | ICD-10-CM

## 2011-09-22 DIAGNOSIS — F329 Major depressive disorder, single episode, unspecified: Secondary | ICD-10-CM

## 2011-09-22 NOTE — Progress Notes (Signed)
  Subjective:    Patient ID: Monique May, female    DOB: [birth year], 41 y.o.   MRN: [id number]  HPI Here to f/u depression. Don very well. Sleeping well. No SExual side effects.  toleating meds well.  On wellbutrin 300 daily. Off the lexapro.    Review of Systems     Objective:   Physical Exam  Constitutional: She is oriented to person, place, and time. She appears well-developed and well-nourished.  HENT:  Head: Normocephalic and atraumatic.  Cardiovascular: Normal rate, regular rhythm and normal heart sounds.   Pulmonary/Chest: Effort normal and breath sounds normal.  Neurological: She is alert and oriented to person, place, and time.  Skin: Skin is warm and dry.  Psychiatric: She has a normal mood and affect. Her behavior is normal.          Assessment & Plan:  Depression - PH-9 score of 0. Dong very well. At goal therapeutically. F/U in 4 months. She has cut back on her alcohol intake which wonderful.   \Declined flu shot.

## 2011-11-23 ENCOUNTER — Other Ambulatory Visit: Payer: Self-pay | Admitting: Family Medicine

## 2011-11-24 ENCOUNTER — Ambulatory Visit (INDEPENDENT_AMBULATORY_CARE_PROVIDER_SITE_OTHER): Payer: PRIVATE HEALTH INSURANCE | Admitting: Family Medicine

## 2011-11-24 VITALS — BP 122/77 | HR 85 | Temp 98.3°F | Wt 130.0 lb

## 2011-11-24 DIAGNOSIS — F329 Major depressive disorder, single episode, unspecified: Secondary | ICD-10-CM | POA: Insufficient documentation

## 2011-11-24 DIAGNOSIS — F3289 Other specified depressive episodes: Secondary | ICD-10-CM

## 2011-11-24 DIAGNOSIS — F32A Depression, unspecified: Secondary | ICD-10-CM | POA: Insufficient documentation

## 2011-11-24 DIAGNOSIS — K589 Irritable bowel syndrome without diarrhea: Secondary | ICD-10-CM

## 2011-11-24 MED ORDER — BUPROPION HCL ER (XL) 300 MG PO TB24: 300.0000 mg | ORAL_TABLET | Freq: Every day | ORAL | Status: DC

## 2011-11-24 MED ORDER — AMBULATORY NON FORMULARY MEDICATION: Status: AC

## 2011-11-24 NOTE — Patient Instructions (Addendum)
Psyllium powder: 1 heaping tablespoon (3.4g) daily for one week, if symptoms persist increase to 2 heaping spoonfuls daily for one week, if symptoms persist increase to 3 heaping tablespoons daily but no more.

## 2011-11-24 NOTE — Progress Notes (Signed)
CC: Monique May is a 41 y.o. female is here for abdominal discomfort   Subjective: HPI:  Patient describes 3 months of abdominal pain is described as a cramping and twisting central low in her pelvis is present right before and during defecation. She has occasional loose stools but overall stools are hard and she feels somewhat constipated. Pain is worse during defecation and is immediately relieved after defecation. Symptoms are present most days of the week. She has a bowel movement every one to 3 days. Interventions have included an herbal aloe preparation and a herbal chlorophyll preparation, neither have helped much. She denies nocturnal symptoms, unintentional weight loss, bloody nor tar-like stools. The pain is nonradiating. It is occasionally associated with nausea but there's been no vomiting. This has interfered with her sex life, she's not sure if pain is reproduced with sex. Pain is not reproduced with menses. Stress levels have increased due to her stepson moving nearby and about to have a baby without much money. This in switching off of Lexapro coincided with the deterioration of her symptoms. She denies fevers, chills, right upper quadrant pain, epigastric pain.  Carries a diagnosis of depression and currently takes Wellbutrin. She is requesting a refill. She states her mood is stable and denies depression or anxiety currently but does feel somewhat down regarding her persistent abdominal symptoms. She plans to followup with Dr. Judie Petit. in January for routine depression followup.   Review Of Systems Outlined In HPI  Past Medical History  Diagnosis Date  . Endometriosis   . Tubal pregnancy   . Depression   . Allergy   . Asthma      Family History  Problem Relation Age of Onset  . Obesity Mother   . Diabetes Mother   . Cirrhosis Father     Hep C, deceased after liver transplant  . Cerebral palsy Sister   . Drug abuse Brother   . Skin cancer Mother   . Hyperlipidemia Mother     . Psychosis Sister   . Bipolar disorder Brother      History  Substance Use Topics  . Smoking status: Never Smoker   . Smokeless tobacco: Not on file  . Alcohol Use: 8.4 oz/week    14 Glasses of wine per week     Objective: Filed Vitals:   11/24/11 0845  BP: 122/77  Pulse: 85  Temp: 98.3 F (36.8 C)    General: Alert and Oriented, No Acute Distress HEENT: Pupils equal, round, reactive to light. Conjunctivae clear.   Moist mucous membranes, pharynx without inflammation nor lesions.   Lungs: Clear to auscultation bilaterally, no wheezing/ronchi/rales.  Comfortable work of breathing. Good air movement. Cardiac: Regular rate and rhythm. Normal S1/S2.  No murmurs, rubs, nor gallops.   Abdomen: Normal bowel sounds, soft and non tender without palpable masses. No rebound tenderness, no right upper quadrant pain  Mental Status: No depression, anxiety, nor agitation. logical and normal thought process Skin: Warm and dry.  Assessment & Plan: Aedan was seen today for abdominal discomfort.  Diagnoses and associated orders for this visit:  Ibs (irritable bowel syndrome) - AMBULATORY NON FORMULARY MEDICATION; Psyllium Product: 3.4g Once a Day  Depression - buPROPion (WELLBUTRIN XL) 300 MG 24 hr tablet; Take 1 tablet (300 mg total) by mouth daily.     she meets for criteria for IBS, constipation predominant. No red flags. I like her to start psyllium 3.4 g daily and will titrate as needed no more than 3 times this  dose in a 24-hour period. Discussed possibility of going back on Lexapro or starting lubiprostone if this does not improve her symptoms. I've asked her to follow up with myself or Dr. Judie Petit in 2-3 weeks. Life stressors may be making her IBS deteriorate. Depression appears to be stable continue Wellbutrin until followup with Dr. Judie Petit.  Return in about 2 weeks (around 12/08/2011).

## 2011-11-25 ENCOUNTER — Telehealth: Payer: Self-pay | Admitting: *Deleted

## 2011-11-25 NOTE — Telephone Encounter (Signed)
Lexapro is one of many medications that has some evidence of helping prevent IBS, however taking this along with bupropion can cause serious side effects since they work somewhat similarly.  When it comes to starting or stopping mood medications like bupropion or lexapro our partners here typically prefer to have the PCP manage that since they know the patient's background a little better.  Please schedule a visit with Dr. Judie Petit for consideration of changing these medications.

## 2011-11-25 NOTE — Telephone Encounter (Signed)
Pt calls and states seen you testerday and you all had discussed that Lexapro can help with symptoms and flares with IBS. Pt would like to know how this works and have some sent to her pharmacy. Please advise

## 2011-11-25 NOTE — Telephone Encounter (Signed)
Pt.notified

## 2011-11-27 ENCOUNTER — Ambulatory Visit (INDEPENDENT_AMBULATORY_CARE_PROVIDER_SITE_OTHER): Payer: PRIVATE HEALTH INSURANCE | Admitting: Family Medicine

## 2011-11-27 ENCOUNTER — Encounter: Payer: Self-pay | Admitting: Family Medicine

## 2011-11-27 VITALS — BP 108/74 | HR 80 | Ht 64.0 in | Wt 129.0 lb

## 2011-11-27 DIAGNOSIS — K589 Irritable bowel syndrome without diarrhea: Secondary | ICD-10-CM

## 2011-11-27 MED ORDER — LUBIPROSTONE 8 MCG PO CAPS: 8.0000 ug | ORAL_CAPSULE | Freq: Two times a day (BID) | ORAL | Status: DC

## 2011-11-27 NOTE — Progress Notes (Signed)
  Subjective:    Patient ID: Monique May, female    DOB: Aug 01, 1970, 41 y.o.   MRN: [id number]  HPI F/U IBS with IBS, constipation predominant - Says she doesn't know what to eat or do. She has started the psyllium that Dr. Margo May had recommended. She is seen him for these symptoms on Monday. She says it really hasn't taken much effect yet but knows that it will take several days to start working. She went online and read a lot of different information. She's been trying to avoid dairy as well gluten free. She does feel little bit better since stopping gluten. She does not have a formal diagnosis gluten intolerance. She is still struggling with constipation. Is affecting her quality of life as well as her sex life. She is at a point that normally she prefers a natural regimen but wants relief. Dr. Ivan May had discussed with her possibility of a prescription medication and she would like to try it.   Review of Systems     Objective:   Physical Exam  Constitutional: She is oriented to person, place, and time. She appears well-developed and well-nourished.  HENT:  Head: Normocephalic and atraumatic.  Cardiovascular: Normal rate, regular rhythm and normal heart sounds.   Pulmonary/Chest: Effort normal and breath sounds normal.  Abdominal: Soft. Bowel sounds are normal. She exhibits no distension and no mass. There is no tenderness. There is no rebound and no guarding.  Neurological: She is alert and oriented to person, place, and time.  Skin: Skin is warm and dry.  Psychiatric: She has a normal mood and affect. Her behavior is normal.          Assessment & Plan:  IBS - we will start with Amitiza.  It has been on the market a little bit longer; we will see if she tolerates this well. Certainly we can switch medications if there is a preference with her insurance company. I did give her samples to start and sent a prescription to the pharmacy. Hopefully she can activate a coupon card and this will  make it fairly affordable for her. Call if not getting significant relief within one to 2 weeks. She should get initial relief within 3 days anesthetist expectation for her. We also discussed potential side effects. We also discussed working on stress levels this is often times aggravate her irritable bowel. We could also consider switching her back to Lexapro. Her symptoms do get worse with her IBS after we took her off Lexapro and put her on Wellbutrin. There as far as her depression is concerned she is well-controlled on Wellbutrin.

## 2011-12-05 ENCOUNTER — Emergency Department (INDEPENDENT_AMBULATORY_CARE_PROVIDER_SITE_OTHER)
Admission: EM | Admit: 2011-12-05 | Discharge: 2011-12-05 | Disposition: A | Discharge status: Home / Self Care | Payer: PRIVATE HEALTH INSURANCE | Source: Home / Self Care | Attending: Family Medicine | Admitting: Family Medicine

## 2011-12-05 DIAGNOSIS — J069 Acute upper respiratory infection, unspecified: Secondary | ICD-10-CM

## 2011-12-05 MED ORDER — PREDNISONE 20 MG PO TABS: 20.0000 mg | ORAL_TABLET | Freq: Two times a day (BID) | ORAL | Status: DC

## 2011-12-05 MED ORDER — AZITHROMYCIN 250 MG PO TABS: ORAL_TABLET | ORAL | Status: DC

## 2011-12-05 MED ORDER — BENZONATATE 200 MG PO CAPS: 200.0000 mg | ORAL_CAPSULE | Freq: Every day | ORAL | Status: DC

## 2011-12-05 NOTE — ED Provider Notes (Signed)
History     CSN: [id number]  Arrival date & time 12/05/11  1545   First MD Initiated Contact with Patient 12/05/11 1644      Chief Complaint  Patient presents with  . Cough    x 4 days  . Nasal Congestion    x 4 days      HPI Comments: Patient complains of approximately 4 day history of gradually progressive URI symptoms beginning with a mild sore throat (now improved), followed by progressive nasal congestion and hoarseness.  A cough started yesterday.  Complains of fatigue but no myalgias.  Cough is now worse at night and generally productive during the day.  There has been no pleuritic pain or wheezes, but she does feel shortness of breath at times.  She has a history of mild asthma but normally only needs to use her albuterol inhaler about 3 to 4 times per month. She had pneumonia about 10 to 15 years ago.  The history is provided by the patient.    Past Medical History  Diagnosis Date  . Endometriosis   . Tubal pregnancy   . Depression   . Allergy   . Asthma     Past Surgical History  Procedure Date  . Exploratory lap     endometriosis  . Appendectomy   . Tonsillectomy   . Rhinoplasty     Family History  Problem Relation Age of Onset  . Obesity Mother   . Diabetes Mother   . Cirrhosis Father     Hep C, deceased after liver transplant  . Cerebral palsy Sister   . Drug abuse Brother   . Skin cancer Mother   . Hyperlipidemia Mother   . Psychosis Sister   . Bipolar disorder Brother     History  Substance Use Topics  . Smoking status: Never Smoker   . Smokeless tobacco: Not on file  . Alcohol Use: 8.4 oz/week    14 Glasses of wine per week    OB History    Grav Para Term Preterm Abortions TAB SAB Ect Mult Living                  Review of Systems + sore throat + cough No pleuritic pain No wheezing + nasal congestion + post-nasal drainage No sinus pain/pressure No itchy/red eyes No earache No hemoptysis + SOB + fever, + chills +  nausea No vomiting + abdominal pain (history of IBS) No diarrhea No urinary symptoms No skin rashes + fatigue No myalgias + headache Used OTC meds without relief  Allergies  Erythromycin  Home Medications   Current Outpatient Rx  Name  Route  Sig  Dispense  Refill  . ALBUTEROL SULFATE HFA 108 (90 BASE) MCG/ACT IN AERS   Inhalation   Inhale 2 puffs into the lungs every 6 (six) hours as needed.   1 Inhaler   1   . AMBULATORY NON FORMULARY MEDICATION      Psyllium Product: 3.4g Once a Day   100 g   0   . BUPROPION HCL ER (XL) 300 MG PO TB24   Oral   Take 1 tablet (300 mg total) by mouth daily.   30 tablet   1   . LUBIPROSTONE 8 MCG PO CAPS   Oral   Take 1 capsule (8 mcg total) by mouth 2 (two) times daily with a meal.   60 capsule   1   . NORETHIN-ETH ESTRAD-FE BIPHAS 1 MG-10 MCG / 10 MCG  PO TABS   Oral   Take 1 tablet by mouth daily.   1 Package   12   . ALBUTEROL SULFATE (2.5 MG/3ML) 0.083% IN NEBU   Nebulization   Take 3 mLs (2.5 mg total) by nebulization every 6 (six) hours as needed for wheezing. Max 4 doses per day   75 mL   0   . AZITHROMYCIN 250 MG PO TABS      Take 2 tabs today; then begin one tab once daily for 4 more days. (Rx void after 12/13/11)   6 each   0   . BENZONATATE 200 MG PO CAPS   Oral   Take 1 capsule (200 mg total) by mouth at bedtime. Take as needed for cough   12 capsule   0   . BUDESONIDE 90 MCG/ACT IN AEPB   Inhalation   Inhale 2 puffs into the lungs 2 (two) times daily.   1 Inhaler   0   . GUAIFENESIN-CODEINE 100-10 MG/5ML PO SYRP   Oral   Take 10 mLs by mouth at bedtime. for cough   120 mL   0   . PREDNISONE 20 MG PO TABS   Oral   Take 1 tablet (20 mg total) by mouth 2 (two) times daily.   10 tablet   0     BP 131/82  Pulse 95  Temp 99 F (37.2 C) (Oral)  Resp 18  Ht 5\' 3"  (1.6 m)  Wt 130 lb (58.968 kg)  BMI 23.03 kg/m2  SpO2 100%  Physical Exam Nursing notes and Vital Signs  reviewed. Appearance:  Patient appears healthy, stated age, and in no acute distress Eyes:  Pupils are equal, round, and reactive to light and accomodation.  Extraocular movement is intact.  Conjunctivae are not inflamed  Ears:  Canals normal.  Tympanic membranes normal.  Nose:  Mildly congested turbinates.  No sinus tenderness.   Pharynx:  Normal Neck:  Supple.  Slightly tender shotty posterior nodes are palpated bilaterally  Lungs:  Clear to auscultation.  Breath sounds are equal.  Heart:  Regular rate and rhythm without murmurs, rubs, or gallops.  Abdomen:  Nontender without masses or hepatosplenomegaly.  Bowel sounds are present.  No CVA or flank tenderness.  Extremities:  No edema.  No calf tenderness Skin:  No rash present.   ED Course  Procedures  none      1. Acute upper respiratory infections of unspecified site       MDM  There is no evidence of bacterial infection today.   Treat symptomatically for now:  Begin prednisone burst.  Prescription written for Benzonatate (Tessalon) to take at bedtime for night-time cough.  Take Mucinex D (guaifenesin with decongestant) twice daily for congestion.  Increase fluid intake, rest. May use Afrin nasal spray (or generic oxymetazoline) twice daily for about 5 days.  Also recommend using saline nasal spray several times daily and saline nasal irrigation (AYR is a common brand) Stop all antihistamines for now, and other non-prescription cough/cold preparations. Continue Ventolin inhaler as needed Begin Azithromycin if not improving about 5 days or if persistent fever develops (Given a prescription to hold, with an expiration date)  Follow-up with family doctor if not improving 7 to 10 days.         Lattie Haw, MD 12/08/11 [phone]

## 2011-12-05 NOTE — ED Notes (Signed)
Monique May complains of productive cough with yellow sputum and nasal congestion for 4 days. She has had a low grade fever, sneezing and hoarseness.

## 2011-12-07 ENCOUNTER — Emergency Department (INDEPENDENT_AMBULATORY_CARE_PROVIDER_SITE_OTHER)
Admission: EM | Admit: 2011-12-07 | Discharge: 2011-12-07 | Disposition: A | Discharge status: Home / Self Care | Payer: PRIVATE HEALTH INSURANCE | Source: Home / Self Care | Attending: Family Medicine | Admitting: Family Medicine

## 2011-12-07 ENCOUNTER — Emergency Department (INDEPENDENT_AMBULATORY_CARE_PROVIDER_SITE_OTHER): Payer: PRIVATE HEALTH INSURANCE

## 2011-12-07 DIAGNOSIS — R059 Cough, unspecified: Secondary | ICD-10-CM

## 2011-12-07 DIAGNOSIS — J209 Acute bronchitis, unspecified: Secondary | ICD-10-CM

## 2011-12-07 DIAGNOSIS — Z8701 Personal history of pneumonia (recurrent): Secondary | ICD-10-CM

## 2011-12-07 DIAGNOSIS — R05 Cough: Secondary | ICD-10-CM

## 2011-12-07 MED ORDER — ALBUTEROL SULFATE (2.5 MG/3ML) 0.083% IN NEBU: 2.5000 mg | INHALATION_SOLUTION | Freq: Four times a day (QID) | RESPIRATORY_TRACT | Status: DC | PRN

## 2011-12-07 MED ORDER — BUDESONIDE 90 MCG/ACT IN AEPB: 2.0000 | INHALATION_SPRAY | Freq: Two times a day (BID) | RESPIRATORY_TRACT | Status: DC

## 2011-12-07 MED ORDER — GUAIFENESIN-CODEINE 100-10 MG/5ML PO SYRP: 10.0000 mL | ORAL_SOLUTION | Freq: Every day | ORAL | Status: DC

## 2011-12-07 NOTE — ED Notes (Signed)
Seen here Friday for cough, unable to get sleep d/t cough.  Taking prednisone and Mucinex- D, did not start ABT yet.

## 2011-12-07 NOTE — ED Provider Notes (Signed)
History     CSN: [id number]  Arrival date & time 12/07/11  1102   First MD Initiated Contact with Patient 12/07/11 1119      Chief complaint:  Persistent cough     HPI Comments: Patient reports that she has started prednisone, but still has a persistent non-productive cough, worse at night.  She often coughs until she gags.  She has persistent low grade fever to 100.4.  She has not had tetanus immunization for about 7 years.  No pleuritic pain or shortness of breath, but she continues to wheeze.  Albuterol MDI has been somewhat helpful.  She states that she has a nebulizer at home and would like Rx for albuterol.  The history is provided by the patient.    Past Medical History  Diagnosis Date  . Endometriosis   . Tubal pregnancy   . Depression   . Allergy   . Asthma     Past Surgical History  Procedure Date  . Exploratory lap     endometriosis  . Appendectomy   . Tonsillectomy   . Rhinoplasty     Family History  Problem Relation Age of Onset  . Obesity Mother   . Diabetes Mother   . Cirrhosis Father     Hep C, deceased after liver transplant  . Cerebral palsy Sister   . Drug abuse Brother   . Skin cancer Mother   . Hyperlipidemia Mother   . Psychosis Sister   . Bipolar disorder Brother     History  Substance Use Topics  . Smoking status: Never Smoker   . Smokeless tobacco: Not on file  . Alcohol Use: 8.4 oz/week    14 Glasses of wine per week    OB History    Grav Para Term Preterm Abortions TAB SAB Ect Mult Living                  Review of Systems + sore throat + cough No pleuritic pain No wheezing + nasal congestion + post-nasal drainage No sinus pain/pressure No itchy/red eyes No earache No hemoptysis No SOB + fever, + chills + nausea No vomiting, but occasionally gags when coughing + abdominal pain + diarrhea (IBS) No urinary symptoms No skin rashes + fatigue No myalgias No headache Used OTC meds without relief  Allergies    Erythromycin  Home Medications   Current Outpatient Rx  Name  Route  Sig  Dispense  Refill  . ALBUTEROL SULFATE HFA 108 (90 BASE) MCG/ACT IN AERS   Inhalation   Inhale 2 puffs into the lungs every 6 (six) hours as needed.   1 Inhaler   1   . ALBUTEROL SULFATE (2.5 MG/3ML) 0.083% IN NEBU   Nebulization   Take 3 mLs (2.5 mg total) by nebulization every 6 (six) hours as needed for wheezing. Max 4 doses per day   75 mL   0   . AMBULATORY NON FORMULARY MEDICATION      Psyllium Product: 3.4g Once a Day   100 g   0   . AZITHROMYCIN 250 MG PO TABS      Take 2 tabs today; then begin one tab once daily for 4 more days. (Rx void after 12/13/11)   6 each   0   . BENZONATATE 200 MG PO CAPS   Oral   Take 1 capsule (200 mg total) by mouth at bedtime. Take as needed for cough   12 capsule   0   .  BUDESONIDE 90 MCG/ACT IN AEPB   Inhalation   Inhale 2 puffs into the lungs 2 (two) times daily.   1 Inhaler   0   . BUPROPION HCL ER (XL) 300 MG PO TB24   Oral   Take 1 tablet (300 mg total) by mouth daily.   30 tablet   1   . GUAIFENESIN-CODEINE 100-10 MG/5ML PO SYRP   Oral   Take 10 mLs by mouth at bedtime. for cough   120 mL   0   . LUBIPROSTONE 8 MCG PO CAPS   Oral   Take 1 capsule (8 mcg total) by mouth 2 (two) times daily with a meal.   60 capsule   1   . NORETHIN-ETH ESTRAD-FE BIPHAS 1 MG-10 MCG / 10 MCG PO TABS   Oral   Take 1 tablet by mouth daily.   1 Package   12   . PREDNISONE 20 MG PO TABS   Oral   Take 1 tablet (20 mg total) by mouth 2 (two) times daily.   10 tablet   0     BP 137/82  Pulse 93  Temp 98.4 F (36.9 C) (Oral)  Resp 18  Ht 5\' 3"  (1.6 m)  Wt 128 lb (58.06 kg)  BMI 22.67 kg/m2  SpO2 100%  Physical Exam Nursing notes and Vital Signs reviewed. Appearance:  Patient appears healthy, stated age, and in no acute distress Eyes:  Pupils are equal, round, and reactive to light and accomodation.  Extraocular movement is intact.   Conjunctivae are not inflamed  Ears:  Canals normal.  Tympanic membranes normal.  Nose:  Mildly congested turbinates.  No sinus tenderness.  Pharynx:  Normal Neck:  Supple.  Slightly tender shotty posterior nodes are palpated bilaterally  Lungs:  Clear to auscultation.  Breath sounds are equal.  Heart:  Regular rate and rhythm without murmurs, rubs, or gallops.  Abdomen:  Nontender without masses or hepatosplenomegaly.  Bowel sounds are present.  No CVA or flank tenderness.  Extremities:  No edema.  No calf tenderness Skin:  No rash present.   ED Course  Procedures none   Dg Chest 2 View  12/07/2011  *RADIOLOGY REPORT*  Clinical Data: Persistent cough.  CHEST - 2 VIEW  Comparison: None.  Findings: Heart size and pulmonary vascularity are normal and the lungs are clear.  No significant osseous abnormality.  IMPRESSION: Normal chest.   Original Report Authenticated By: Francene Boyers, M.D.      1. Acute bronchitis    ? pertussis   MDM  Add albuterol by nebulizer.  Add Pulmicort. Begin Azithromycin.  Continue prednisone.  Continue Mucinex. Recommend a Tdap when well.        Lattie Haw, MD 12/08/11 [phone]

## 2011-12-08 ENCOUNTER — Ambulatory Visit: Payer: PRIVATE HEALTH INSURANCE | Admitting: Family Medicine

## 2011-12-09 ENCOUNTER — Telehealth: Payer: Self-pay | Admitting: *Deleted

## 2011-12-25 ENCOUNTER — Ambulatory Visit (INDEPENDENT_AMBULATORY_CARE_PROVIDER_SITE_OTHER): Payer: PRIVATE HEALTH INSURANCE | Admitting: Family Medicine

## 2011-12-25 ENCOUNTER — Encounter: Payer: Self-pay | Admitting: Family Medicine

## 2011-12-25 VITALS — BP 122/85 | HR 76 | Ht 63.0 in | Wt 128.0 lb

## 2011-12-25 DIAGNOSIS — F3289 Other specified depressive episodes: Secondary | ICD-10-CM

## 2011-12-25 DIAGNOSIS — F32A Depression, unspecified: Secondary | ICD-10-CM

## 2011-12-25 DIAGNOSIS — F329 Major depressive disorder, single episode, unspecified: Secondary | ICD-10-CM

## 2011-12-25 DIAGNOSIS — Z0000 Encounter for general adult medical examination without abnormal findings: Secondary | ICD-10-CM

## 2011-12-25 DIAGNOSIS — Z23 Encounter for immunization: Secondary | ICD-10-CM

## 2011-12-25 DIAGNOSIS — K589 Irritable bowel syndrome without diarrhea: Secondary | ICD-10-CM

## 2011-12-25 MED ORDER — INFLUENZA VIRUS VACC SPLIT PF IM SUSP: 0.5000 mL | Freq: Once | INTRAMUSCULAR | Status: DC

## 2011-12-25 MED ORDER — ESCITALOPRAM OXALATE 10 MG PO TABS: 10.0000 mg | ORAL_TABLET | Freq: Every day | ORAL | Status: DC

## 2011-12-25 MED ORDER — TETANUS-DIPHTH-ACELL PERTUSSIS 5-2.5-18.5 LF-MCG/0.5 IM SUSP: 0.5000 mL | Freq: Once | INTRAMUSCULAR | Status: DC

## 2011-12-25 MED ORDER — BUPROPION HCL ER (XL) 150 MG PO TB24: 150.0000 mg | ORAL_TABLET | Freq: Every day | ORAL | Status: DC

## 2011-12-25 NOTE — Patient Instructions (Signed)
Start the Wellbutrin 150 mg tabs daily for 7 days. Didn't start taking one every other day. When she starts the every other day with the Wellbutrin you can start your new medication, Lexapro, once a day. Followup with me in 5 weeks.

## 2011-12-25 NOTE — Progress Notes (Signed)
  Subjective:    Patient ID: Monique May, female    DOB: May 28, 1970, 41 y.o.   MRN: [id number]  HPI She's here to followup on her depression and irritable bowel syndrome. 4 irritable bowel syndrome she says the Amitiza is working very well. She says the back she feels like maybe some of her stools are a little too loose she wants and if it's okay to drop the medication down to once a day. As far as her mood is concerned she's tolerating the Wellbutrin well but think she actually felt better on the Lexapro. She still considering changing back to Lexapro. She is mostly concerned about potential for weight gain and affect on her sex try. Though she admits that her sex drive has not changed since she came off the Lexapro so maybe it really has not caused any issues there. She says she just felt a little less durable on the Lexapro compared to the Wellbutrin.  Review of Systems     Objective:   Physical Exam  Constitutional: She is oriented to person, place, and time. She appears well-developed and well-nourished.  HENT:  Head: Normocephalic and atraumatic.  Cardiovascular: Normal rate, regular rhythm and normal heart sounds.   Pulmonary/Chest: Effort normal and breath sounds normal.  Neurological: She is alert and oriented to person, place, and time.  Skin: Skin is warm and dry.  Psychiatric: She has a normal mood and affect. Her behavior is normal.          Assessment & Plan:  Irritable bowel syndrome-she's currently pain $50 a month for the Amitiza. Encouraged her to go online check to see if there any coupon cards that can be printed off of the pharmaceutical website. Also consider Linzess as an option if it's cheaper on her plan. Currently she is doing well and certainly she can call the Amitiza down to once a day and put a little bit to see if it gets her better regulated.  Depression-she's actually doing very well overall with her depressive symptoms. Her PHQ 9 score is 1 today. The  biggest difference as irritability. She felt like this was much less on Lexapro. I did let her know that typically there is no significant weight gain or weight loss her Lexapro. Sexual side effects are still potentially there but that is something that she would have to try and let me know. She tolerated it well in the past so we will restart it today. Given written instructions to wean her off the Wellbutrin and transition to Lexapro. Followup in 5 weeks.

## 2012-01-29 ENCOUNTER — Telehealth: Payer: Self-pay | Admitting: *Deleted

## 2012-01-29 ENCOUNTER — Encounter: Payer: Self-pay | Admitting: Family Medicine

## 2012-01-29 ENCOUNTER — Ambulatory Visit (INDEPENDENT_AMBULATORY_CARE_PROVIDER_SITE_OTHER): Payer: PRIVATE HEALTH INSURANCE | Admitting: Family Medicine

## 2012-01-29 VITALS — BP 116/72 | HR 75 | Wt 130.0 lb

## 2012-01-29 DIAGNOSIS — F32A Depression, unspecified: Secondary | ICD-10-CM

## 2012-01-29 DIAGNOSIS — G43109 Migraine with aura, not intractable, without status migrainosus: Secondary | ICD-10-CM

## 2012-01-29 DIAGNOSIS — F329 Major depressive disorder, single episode, unspecified: Secondary | ICD-10-CM

## 2012-01-29 DIAGNOSIS — F3289 Other specified depressive episodes: Secondary | ICD-10-CM

## 2012-01-29 MED ORDER — TOPIRAMATE 25 MG PO TABS: ORAL_TABLET | ORAL | Status: DC

## 2012-01-29 MED ORDER — FROVATRIPTAN SUCCINATE 2.5 MG PO TABS: 2.5000 mg | ORAL_TABLET | ORAL | Status: DC | PRN

## 2012-01-29 NOTE — Progress Notes (Signed)
  Subjective:    Patient ID: Monique May, female    DOB: Jul 17, 1970, 42 y.o.   MRN: [id number]  HPI Depression- Doing well on lexapro. Has weaned of the wellbutrin. Has had more frequent HA over the last 2-3 weeks.  Sleeping well.  She says normally January severe difficult time for her because her father passed away 4 years ago Lamisil in. She says overall she's actually been dealing with it very well this year.  HA- having pulasating HA near her temples, like her migraines.  Has had more frequent HA over the last 2-3 weeks.  Sleeping well.  No known triggers.  HA are daily.  Does have aura.  Notices squiggles in peripheral vision and then will get a HA.  Tried she thinks imitrex in the past and says it gave her a hangover effect. REally didn't like the way he felt on it. She has never tried any other tryptans. She's never been on anything prophylactically.   Review of Systems     Objective:   Physical Exam  Constitutional: She is oriented to person, place, and time. She appears well-developed and well-nourished.  HENT:  Head: Normocephalic and atraumatic.  Cardiovascular: Normal rate, regular rhythm and normal heart sounds.   Pulmonary/Chest: Effort normal and breath sounds normal.  Neurological: She is alert and oriented to person, place, and time.  Skin: Skin is warm and dry.  Psychiatric: She has a normal mood and affect. Her behavior is normal.          Assessment & Plan:  Depression  - PHQ- 9 score of 3.  Doing well overall. She's very happy with her change back to her Lexapro. She says normally January severe difficult time for her because her father passed away 4 years ago Lamisil in. She says overall she's actually been dealing with it very well this year.  Migarine w/ aura - uncontrolled. We discussed prophylactic options. I think she'll be good candidate for Topamax. We discussed potential side effects. We'll start with 25 mg at bedtime times one week and then increase to  twice a day. Followup in 8 weeks to make sure that she's doing well. Also consider that her recent increase in headaches could be due to the transition off of Wellbutrin on to Lexapro. Continue work on stress levels and making sure that she's getting adequate sleep. We will also try a different tryptans since she did not tolerate the Imitrex well. I gave her a sample of Frova 2.5mg .

## 2012-02-06 ENCOUNTER — Telehealth: Payer: Self-pay | Admitting: *Deleted

## 2012-02-06 NOTE — Telephone Encounter (Signed)
Given 2 meds for migraines Topamax, frova- these made her feel like face and jaw burns when opens mouth(facial cramps) then all muscles and joints ached, general feeling of not good, insomnia and it never completely took the headache away. Pt states stopped both of those meds and does have h/a still. Pt has appt in 7 weeks- so she will give it 2 more weeks to see if it resolves but if doesn't then call and make a sooner appointment. Just FYI- states you wanted to know how she was doing.

## 2012-02-06 NOTE — Telephone Encounter (Signed)
Did she try the topamax by itself. She was to start with such a low dose it would be very unusual for her to have face and jaw burning. Certainly still possible but would be very unusual. She did try Topamax by itself and it causes symptoms then we can try something else if she would like me to call in a new prescription so that she can at least start something new and still keep her followup appointment in 7 weeks.

## 2012-02-08 ENCOUNTER — Other Ambulatory Visit: Payer: Self-pay | Admitting: Family Medicine

## 2012-02-09 ENCOUNTER — Telehealth: Payer: Self-pay | Admitting: *Deleted

## 2012-02-09 NOTE — Telephone Encounter (Signed)
Pharmacy notified.

## 2012-02-09 NOTE — Telephone Encounter (Signed)
FYI  She states she is doing better off the medication and wants to give it a couple weeks and call back if she needs a new medication.

## 2012-02-09 NOTE — Telephone Encounter (Signed)
Left message for patient to return call. Medication 

## 2012-02-09 NOTE — Telephone Encounter (Signed)
Pharmacist calls and sttaes that he needed to verify that the pt is taking the Lo-estrin continuosly and skipping the inactive tablets so they can bill insurance correctly.

## 2012-02-09 NOTE — Telephone Encounter (Signed)
Yes, taking continuously.

## 2012-03-07 ENCOUNTER — Other Ambulatory Visit: Payer: Self-pay | Admitting: Family Medicine

## 2012-03-25 ENCOUNTER — Ambulatory Visit: Payer: BC Managed Care – PPO | Admitting: Family Medicine

## 2012-04-26 ENCOUNTER — Other Ambulatory Visit: Payer: Self-pay | Admitting: Family Medicine

## 2012-04-29 ENCOUNTER — Other Ambulatory Visit: Payer: Self-pay | Admitting: Family Medicine

## 2012-05-04 ENCOUNTER — Encounter: Payer: Self-pay | Admitting: Family Medicine

## 2012-05-04 ENCOUNTER — Ambulatory Visit (INDEPENDENT_AMBULATORY_CARE_PROVIDER_SITE_OTHER): Payer: BC Managed Care – PPO | Admitting: Family Medicine

## 2012-05-04 VITALS — BP 109/69 | HR 65 | Ht 63.0 in | Wt 134.0 lb

## 2012-05-04 DIAGNOSIS — K589 Irritable bowel syndrome without diarrhea: Secondary | ICD-10-CM

## 2012-05-04 DIAGNOSIS — F32A Depression, unspecified: Secondary | ICD-10-CM

## 2012-05-04 DIAGNOSIS — F3289 Other specified depressive episodes: Secondary | ICD-10-CM

## 2012-05-04 DIAGNOSIS — F329 Major depressive disorder, single episode, unspecified: Secondary | ICD-10-CM

## 2012-05-04 MED ORDER — ESCITALOPRAM OXALATE 20 MG PO TABS: 20.0000 mg | ORAL_TABLET | Freq: Every day | ORAL | Status: DC

## 2012-05-04 MED ORDER — FLUTICASONE PROPIONATE 50 MCG/ACT NA SUSP: 2.0000 | Freq: Every day | NASAL | Status: DC

## 2012-05-04 NOTE — Progress Notes (Signed)
  Subjective:    Patient ID: Monique May, female    DOB: 1970-03-06, 42 y.o.   MRN: [id number]  HPI F/U depression - Has been feelingmore down over the last 4 weeks.  She is not sleeping well and has gained weight. ON Lexapro 10mg  but not well controlled.  Waking up for more frequently the last 2 weeks nad then can't go back to sleep for 2 hours.  Then wants to sleep all day. She has been more stressed recently.  She has support through her church.    Review of Systems     Objective:   Physical Exam  Constitutional: She is oriented to person, place, and time. She appears well-developed and well-nourished.  HENT:  Head: Normocephalic and atraumatic.  Right Ear: External ear normal.  Left Ear: External ear normal.  Nose: Nose normal.  Mouth/Throat: Oropharynx is clear and moist.  TMs and canals are clear.   Eyes: Conjunctivae and EOM are normal. Pupils are equal, round, and reactive to light.  Neck: Neck supple. No thyromegaly present.  Cardiovascular: Normal rate, regular rhythm and normal heart sounds.   Pulmonary/Chest: Effort normal and breath sounds normal. She has no wheezes.  Lymphadenopathy:    She has no cervical adenopathy.  Neurological: She is alert and oriented to person, place, and time.  Skin: Skin is warm and dry.  Psychiatric: She has a normal mood and affect.          Assessment & Plan:  Depression - PHQ - 9 score of 13 today. Uncontrolled.  Discussed optioin to adjust treatment options. Lexapro works well for her IBS. Will increase lexapro to 20mg .  F/U in 1 months.  Encouraged her to use that counseling through her church for support. I be happy to refer her to a therapist her building if she would like to she declined at this time. Certainly call me if she feels like her mood is getting worse even with change in her medication.  Irritable bowel syndrome-under fair control right now. Response really well to the Lexapro.

## 2012-06-03 ENCOUNTER — Ambulatory Visit (INDEPENDENT_AMBULATORY_CARE_PROVIDER_SITE_OTHER): Payer: BC Managed Care – PPO | Admitting: Family Medicine

## 2012-06-03 ENCOUNTER — Encounter: Payer: Self-pay | Admitting: Family Medicine

## 2012-06-03 VITALS — BP 98/65 | HR 75 | Wt 135.0 lb

## 2012-06-03 DIAGNOSIS — F32A Depression, unspecified: Secondary | ICD-10-CM

## 2012-06-03 DIAGNOSIS — F3289 Other specified depressive episodes: Secondary | ICD-10-CM

## 2012-06-03 DIAGNOSIS — F329 Major depressive disorder, single episode, unspecified: Secondary | ICD-10-CM

## 2012-06-03 NOTE — Progress Notes (Signed)
  Subjective:    Patient ID: Monique May, female    DOB: [birth year], 42 y.o.   MRN: [id number]  HPI Depression - hasn't started counseling. Has been sleeping better. Has been exercising. Walkiing 3 miles in the AM.  Happy with current dose of lexapro 20mg .  Still occ down and troble concentrating. Still feels a little tired.    Review of Systems     Objective:   Physical Exam  Constitutional: She is oriented to person, place, and time. She appears well-developed and well-nourished.  HENT:  Head: Normocephalic and atraumatic.  Cardiovascular: Normal rate, regular rhythm and normal heart sounds.   Pulmonary/Chest: Effort normal and breath sounds normal.  Neurological: She is alert and oriented to person, place, and time.  Skin: Skin is warm and dry.  Psychiatric: She has a normal mood and affect. Her behavior is normal.          Assessment & Plan:  Depression - Well controlled.  PHQ- 9 score of 4.  F/U in 3-4 months. Encouraged to consider counseling if needed.

## 2012-07-08 ENCOUNTER — Telehealth: Payer: Self-pay | Admitting: *Deleted

## 2012-07-08 MED ORDER — SUMATRIPTAN SUCCINATE 100 MG PO TABS: 100.0000 mg | ORAL_TABLET | Freq: Every day | ORAL | Status: DC | PRN

## 2012-07-08 NOTE — Telephone Encounter (Signed)
Pt notified med sent to pharmacy. Brenten Janney, LPN  

## 2012-07-08 NOTE — Telephone Encounter (Signed)
Pt calls and states that a while ago you had offered her migraine med and she declined due to side effects and now pt wants to know if you would call that in for her- she has had migraines lateley and has one today and willing to try the med now. Barry Dienes, LPN

## 2012-07-08 NOTE — Telephone Encounter (Signed)
Pharmacy calls and states the med she was on was Frova but it requires a PA so wants to try generic Imitrex or something like that

## 2012-07-08 NOTE — Telephone Encounter (Signed)
Sent over generic imitrex.

## 2012-07-15 ENCOUNTER — Ambulatory Visit (INDEPENDENT_AMBULATORY_CARE_PROVIDER_SITE_OTHER): Payer: BC Managed Care – PPO | Admitting: Family Medicine

## 2012-07-15 ENCOUNTER — Encounter: Payer: Self-pay | Admitting: Family Medicine

## 2012-07-15 ENCOUNTER — Other Ambulatory Visit: Payer: Self-pay | Admitting: Family Medicine

## 2012-07-15 VITALS — BP 104/65 | HR 65 | Ht 63.0 in | Wt 134.0 lb

## 2012-07-15 DIAGNOSIS — G43901 Migraine, unspecified, not intractable, with status migrainosus: Secondary | ICD-10-CM

## 2012-07-15 MED ORDER — TOPIRAMATE 50 MG PO TABS: ORAL_TABLET | ORAL | Status: DC

## 2012-07-15 MED ORDER — ESCITALOPRAM OXALATE 20 MG PO TABS: 20.0000 mg | ORAL_TABLET | Freq: Every day | ORAL | Status: DC

## 2012-07-15 NOTE — Patient Instructions (Addendum)
Can try the frova samples or zomig nasal spray.

## 2012-07-15 NOTE — Progress Notes (Signed)
  Subjective:    Patient ID: Monique May, female    DOB: 1970/10/24, 42 y.o.   MRN: [id number]  HPI  Migraine w/ aura - Frova was too expensive.  Tried the generic imitrex. HA did improve. She decided to restart the topamax. On BID 25mg  now.  Does get a burning sensation in upper chest and throat when she take the imitex.   Review of Systems     Objective:   Physical Exam  Constitutional: She is oriented to person, place, and time. She appears well-developed and well-nourished.  HENT:  Head: Normocephalic and atraumatic.  Neck: Neck supple. No thyromegaly present.  Cardiovascular: Normal rate, regular rhythm and normal heart sounds.   Pulmonary/Chest: Effort normal and breath sounds normal.  Lymphadenopathy:    She has no cervical adenopathy.  Neurological: She is alert and oriented to person, place, and time.  Skin: Skin is warm and dry.  Psychiatric: She has a normal mood and affect. Her behavior is normal.          Assessment & Plan:  Migarine - uncontrolled. Will increase topamax to 50mg  once complete current bottle.  F/U in 2 months. Continue to try generic Imitrex so she says she didn't like how she fell after she took it because of the burning sensation. He gave her samples of Frova 2.5 mg today as well as somatic nasal spray.  Depression - PHQ-9 of 4 .  Well controlled. Continue current regimen.

## 2012-07-24 ENCOUNTER — Other Ambulatory Visit: Payer: Self-pay | Admitting: Family Medicine

## 2012-07-27 ENCOUNTER — Telehealth: Payer: Self-pay | Admitting: *Deleted

## 2012-07-27 MED ORDER — ZOLMITRIPTAN 5 MG NA SOLN: 1.0000 | NASAL | Status: DC | PRN

## 2012-07-27 NOTE — Telephone Encounter (Signed)
rx sent. Make sure to use coupon card.

## 2012-07-27 NOTE — Telephone Encounter (Signed)
Pt states the Zomig nasal spray sample is working and would like a prescription sent to her pharmacy- [facility]

## 2012-08-26 ENCOUNTER — Ambulatory Visit (INDEPENDENT_AMBULATORY_CARE_PROVIDER_SITE_OTHER): Payer: BC Managed Care – PPO | Admitting: Family Medicine

## 2012-08-26 ENCOUNTER — Encounter: Payer: Self-pay | Admitting: Family Medicine

## 2012-08-26 VITALS — BP 105/67 | HR 77 | Wt 135.0 lb

## 2012-08-26 DIAGNOSIS — F32A Depression, unspecified: Secondary | ICD-10-CM

## 2012-08-26 DIAGNOSIS — K6289 Other specified diseases of anus and rectum: Secondary | ICD-10-CM

## 2012-08-26 DIAGNOSIS — F3289 Other specified depressive episodes: Secondary | ICD-10-CM

## 2012-08-26 DIAGNOSIS — F329 Major depressive disorder, single episode, unspecified: Secondary | ICD-10-CM

## 2012-08-26 MED ORDER — ZOLMITRIPTAN 5 MG NA SOLN: 1.0000 | NASAL | Status: DC | PRN

## 2012-08-26 MED ORDER — BUPROPION HCL ER (XL) 150 MG PO TB24: 150.0000 mg | ORAL_TABLET | ORAL | Status: DC

## 2012-08-26 NOTE — Progress Notes (Signed)
  Subjective:    Patient ID: Monique May, female    DOB: 02/17/1970, 42 y.o.   MRN: [id number]  HPI Depression-here for followup. She feels that her mood is down a little bit. She complains of feeling tired and having little energy every day. She also complains of feeling down and having poor appetite as well as trouble concentrating more than half the days.  Not on medication.. Just got back form vacation. Has been sleeping more.  Hasn't  Been able ot concetrate at work. She does not want to discontinue let her Lexapro but wants and if there's something that she can add to it.  Has been havng diarrhea.  Has a lot of pressure when she sits down. She wonders if she could be developing hemorrhoids. She's also noticed a little extra fleshy tissue when she takes a shower. She has not tried any over-the-counter treatments.  Review of Systems     Objective:   Physical Exam  Constitutional: She is oriented to person, place, and time. She appears well-developed and well-nourished.  HENT:  Head: Normocephalic and atraumatic.  Cardiovascular: Normal rate, regular rhythm and normal heart sounds.   Pulmonary/Chest: Effort normal and breath sounds normal.  Neurological: She is alert and oriented to person, place, and time.  Skin: Skin is warm and dry.  Psychiatric: She has a normal mood and affect. Her behavior is normal.    She declined to let me look at her rectal area today.      Assessment & Plan:   Depression-Her PHQ 9 score is 14 today.F/U in 3-4 weeks. We discussed different treatment options including counseling or therapy versus medications. She says she was interested in starting medication. We discussed the pros and cons of antidepressant. We will start with Wellbutrin. Discussed potential side effects of the medication. Followup in 3-4 weeks to make sure that she's tolerating it well and to adjust her dose as needed. I explained her that if she is not reaching a therapeutic goal with her  depression and we may need to consider discontinuing the Lexapro in trying something else in its place. For now we will just try to reduce the fracture the Lexapro by adding Wellbutrin.  Rectal pain-most likely hemorrhoids. The she was shy and declined to have an exam today. I recommended over-the-counter treatment such as Preparation H or Tucks pads. Warm sitz baths can be helpful as well. Also getting the diarrhea under control will help as well. Also make sure that this. Become too hard where she has to strain. If she feels she's getting worse or just not improving then recommend followup for evaluation to confirm diagnosis and then discussed prescription strength treatments.  Time spent 25 minutes, greater than 50% of time spent counseling about depression and rectal pain.

## 2012-08-27 ENCOUNTER — Encounter: Payer: Self-pay | Admitting: Family Medicine

## 2012-08-31 ENCOUNTER — Telehealth: Payer: Self-pay

## 2012-08-31 NOTE — Telephone Encounter (Signed)
Monique May left a message stating she is still tired and would like to stop the migraine medication completely. Please advise.

## 2012-08-31 NOTE — Telephone Encounter (Signed)
Yes, ok to taper.  On 50mg  BID, can dec to once a day for 4-5 days then half tab dail for 4-5 days and then stop.  If after 2-3 off the med not feeling like energy is better then it may not be the topamax.

## 2012-08-31 NOTE — Telephone Encounter (Signed)
Patient advised.

## 2012-09-07 ENCOUNTER — Other Ambulatory Visit: Payer: Self-pay | Admitting: Family Medicine

## 2012-09-14 ENCOUNTER — Ambulatory Visit (INDEPENDENT_AMBULATORY_CARE_PROVIDER_SITE_OTHER): Payer: BC Managed Care – PPO | Admitting: Family Medicine

## 2012-09-14 ENCOUNTER — Encounter: Payer: Self-pay | Admitting: Family Medicine

## 2012-09-14 VITALS — BP 104/68 | HR 71 | Wt 134.0 lb

## 2012-09-14 DIAGNOSIS — F329 Major depressive disorder, single episode, unspecified: Secondary | ICD-10-CM

## 2012-09-14 DIAGNOSIS — Z23 Encounter for immunization: Secondary | ICD-10-CM

## 2012-09-14 DIAGNOSIS — F32A Depression, unspecified: Secondary | ICD-10-CM

## 2012-09-14 DIAGNOSIS — F3289 Other specified depressive episodes: Secondary | ICD-10-CM

## 2012-09-14 MED ORDER — BUPROPION HCL ER (XL) 300 MG PO TB24: 300.0000 mg | ORAL_TABLET | ORAL | Status: DC

## 2012-09-14 MED ORDER — TRAMADOL HCL 50 MG PO TABS: 50.0000 mg | ORAL_TABLET | Freq: Three times a day (TID) | ORAL | Status: DC | PRN

## 2012-09-14 NOTE — Progress Notes (Signed)
  Subjective:    Patient ID: Monique May, female    DOB: December 18, 1970, 42 y.o.   MRN: [id number]  HPI Depression - On lexapro 20mg  and will increase wellbutrin. Overall noticed improvement after starting the wellbutrin but has noticed wants to avoid crowded situations and has a sense that she is going to pass out and have a seizure. Says this has not happened before but has a fear of it. Sleep is not as good but doesn't feel as tired during the daytime so that has been much better.   Review of Systems     Objective:   Physical Exam  Constitutional: She is oriented to person, place, and time. She appears well-developed and well-nourished.  HENT:  Head: Normocephalic and atraumatic.  Eyes: Conjunctivae and EOM are normal.  Cardiovascular: Normal rate.   Pulmonary/Chest: Effort normal.  Neurological: She is alert and oriented to person, place, and time.  Skin: Skin is dry. No pallor.  Psychiatric: She has a normal mood and affect. Her behavior is normal.          Assessment & Plan:  Depression - Will inc wellbutrin to 300mg  and continue Lexapro.  F/U 4-6 weeks.  Call if any problems.   Time spent 20 min, >50% spent counseling about mood.

## 2012-09-21 ENCOUNTER — Other Ambulatory Visit: Payer: Self-pay | Admitting: Family Medicine

## 2012-09-21 ENCOUNTER — Ambulatory Visit: Payer: BC Managed Care – PPO

## 2012-09-21 DIAGNOSIS — Z139 Encounter for screening, unspecified: Secondary | ICD-10-CM

## 2012-09-25 ENCOUNTER — Other Ambulatory Visit: Payer: Self-pay | Admitting: Family Medicine

## 2012-09-28 ENCOUNTER — Ambulatory Visit (INDEPENDENT_AMBULATORY_CARE_PROVIDER_SITE_OTHER): Payer: BC Managed Care – PPO

## 2012-09-28 DIAGNOSIS — Z1231 Encounter for screening mammogram for malignant neoplasm of breast: Secondary | ICD-10-CM

## 2012-09-28 DIAGNOSIS — Z139 Encounter for screening, unspecified: Secondary | ICD-10-CM

## 2012-11-14 ENCOUNTER — Other Ambulatory Visit: Payer: Self-pay | Admitting: Family Medicine

## 2012-11-15 ENCOUNTER — Telehealth: Payer: Self-pay | Admitting: *Deleted

## 2012-11-15 DIAGNOSIS — Z0000 Encounter for general adult medical examination without abnormal findings: Secondary | ICD-10-CM

## 2012-11-15 DIAGNOSIS — E785 Hyperlipidemia, unspecified: Secondary | ICD-10-CM

## 2012-11-16 LAB — COMPLETE METABOLIC PANEL WITH GFR
Albumin: 3.9 g/dL (ref 3.5–5.2)
Alkaline Phosphatase: 56 U/L (ref 39–117)
CO2: 27 mEq/L (ref 19–32)
GFR, Est Non African American: 87 mL/min
Glucose, Bld: 92 mg/dL (ref 70–99)
Potassium: 4.3 mEq/L (ref 3.5–5.3)
Sodium: 139 mEq/L (ref 135–145)
Total Protein: 6.5 g/dL (ref 6.0–8.3)

## 2012-11-16 LAB — LIPID PANEL: Total CHOL/HDL Ratio: 3.2 Ratio

## 2012-11-26 ENCOUNTER — Ambulatory Visit (INDEPENDENT_AMBULATORY_CARE_PROVIDER_SITE_OTHER): Payer: BC Managed Care – PPO | Admitting: Family Medicine

## 2012-11-26 ENCOUNTER — Encounter: Payer: Self-pay | Admitting: Family Medicine

## 2012-11-26 VITALS — BP 117/74 | HR 77 | Wt 134.0 lb

## 2012-11-26 DIAGNOSIS — Z0000 Encounter for general adult medical examination without abnormal findings: Secondary | ICD-10-CM

## 2012-11-26 NOTE — Progress Notes (Signed)
  Subjective:     Monique May is a 42 y.o. female and is here for a comprehensive physical exam. The patient reports no problems.  History   Social History  . Marital Status: Married    Spouse Name: N/A    Number of Children: 0  . Years of Education: N/A   Occupational History  . [employer] [place]   Social History Main Topics  . Smoking status: Never Smoker   . Smokeless tobacco: Not on file  . Alcohol Use: 8.4 oz/week    14 Glasses of wine per week  . Drug Use: Not on file  . Sexual Activity: Yes    Partners: Male    Birth Control/ Protection: Condom     Comment: Works for [employer] [place], married, no kids, runs, good diet.   Other Topics Concern  . Not on file   Social History Narrative   Regular exercise.    Health Maintenance  Topic Date Due  . Influenza Vaccine  08/06/2013  . Pap Smear  06/10/2014  . Tetanus/tdap  12/24/2021    The following portions of the patient's history were reviewed and updated as appropriate: allergies, current medications, past family history, past medical history, past social history, past surgical history and problem list.  Review of Systems A comprehensive review of systems was negative.   Objective:    BP 117/74  Pulse 77  Wt 134 lb (60.782 kg) General appearance: alert, cooperative and appears stated age Head: Normocephalic, without obvious abnormality, atraumatic Eyes: conj clear, EOMI, PEERLA Ears: normal TM's and external ear canals both ears Nose: Nares normal. Septum midline. Mucosa normal. No drainage or sinus tenderness. Throat: lips, mucosa, and tongue normal; teeth and gums normal Neck: no adenopathy, no carotid bruit, no JVD, supple, symmetrical, trachea midline and thyroid not enlarged, symmetric, no tenderness/mass/nodules Back: symmetric, no curvature. ROM normal. No CVA tenderness. Lungs: clear to auscultation bilaterally Breasts: normal appearance, no masses or tenderness Heart: regular rate  and rhythm, S1, S2 normal, no murmur, click, rub or gallop Abdomen: soft, non-tender; bowel sounds normal; no masses,  no organomegaly Extremities: extremities normal, atraumatic, no cyanosis or edema Pulses: 2+ and symmetric Skin: Skin color, texture, turgor normal. No rashes or lesions Lymph nodes: Cervical, supraclavicular, and axillary nodes normal. Neurologic: Alert and oriented X 3, normal strength and tone. Normal symmetric reflexes. Normal coordination and gait    Assessment:    Healthy female exam.      Plan:     See After Visit Summary for Counseling Recommendations  Keep up a regular exercise program and make sure you are eating a healthy diet Try to eat 4 servings of dairy a day, or if you are lactose intolerant take a calcium with vitamin D daily.  Your vaccines are up to date.

## 2012-12-13 ENCOUNTER — Other Ambulatory Visit: Payer: Self-pay | Admitting: Family Medicine

## 2012-12-23 ENCOUNTER — Ambulatory Visit (INDEPENDENT_AMBULATORY_CARE_PROVIDER_SITE_OTHER): Payer: BC Managed Care – PPO | Admitting: Sports Medicine

## 2012-12-23 ENCOUNTER — Ambulatory Visit (INDEPENDENT_AMBULATORY_CARE_PROVIDER_SITE_OTHER): Payer: BC Managed Care – PPO

## 2012-12-23 ENCOUNTER — Encounter: Payer: Self-pay | Admitting: Sports Medicine

## 2012-12-23 VITALS — BP 125/78 | HR 85 | Wt 134.0 lb

## 2012-12-23 DIAGNOSIS — M25572 Pain in left ankle and joints of left foot: Secondary | ICD-10-CM | POA: Insufficient documentation

## 2012-12-23 DIAGNOSIS — M25579 Pain in unspecified ankle and joints of unspecified foot: Secondary | ICD-10-CM

## 2012-12-23 MED ORDER — MELOXICAM 15 MG PO TABS: ORAL_TABLET | ORAL | Status: DC

## 2012-12-23 NOTE — Progress Notes (Signed)
   Subjective:    I'm seeing this patient as a consultation for:  Dr. Linford Arnold  CC: Left ankle pain  HPI: This is a very pleasant 42 year old female, for years now she's had pain which he localizes diffusely in the ankle, but in further questioning seems to localize over the lateral mortise. It's worse when running, and generally with weightbearing. She does get occasional swelling but no mechanical symptoms. She has been through months of physical therapy with only mild improvement. It sounds as though she's tried over-the-counter analgesics, as well as some form of custom orthotics. Nothing has helped and she is eager to get aggressive. Pain is moderate, persistent.  Past medical history, Surgical history, Family history not pertinant except as noted below, Social history, Allergies, and medications have been entered into the medical record, reviewed, and no changes needed.   Review of Systems: No headache, visual changes, nausea, vomiting, diarrhea, constipation, dizziness, abdominal pain, skin rash, fevers, chills, night sweats, weight loss, swollen lymph nodes, body aches, joint swelling, muscle aches, chest pain, shortness of breath, mood changes, visual or auditory hallucinations.   Objective:   General: Well Developed, well nourished, and in no acute distress.  Neuro/Psych: Alert and oriented x3, extra-ocular muscles intact, able to move all 4 extremities, sensation grossly intact. Skin: Warm and dry, no rashes noted.  Respiratory: Not using accessory muscles, speaking in full sentences, trachea midline.  Cardiovascular: Pulses palpable, no extremity edema. Abdomen: Does not appear distended. Left Ankle: No visible erythema or swelling. Range of motion is full in all directions. Strength is 5/5 in all directions. Stable lateral and medial ligaments; squeeze test and kleiger test unremarkable; Minimal tenderness to palpation over the left sinus tarsi as well as the lateral ankle  mortise. No pain at base of 5th MT; No tenderness over cuboid; No tenderness over N spot or navicular prominence No tenderness on posterior aspects of lateral and medial malleolus No sign of peroneal tendon subluxations or tenderness to palpation Negative tarsal tunnel tinel's Able to walk 4 steps.  Ankle x-rays were visualized, there is no degenerative change.  Impression and Recommendations:   This case required medical decision making of moderate complexity.

## 2012-12-23 NOTE — Assessment & Plan Note (Addendum)
Pain is localized over the lateral mortise as well as sinus tarsi. We are going to start conservatively with Mobic, home rehabilitation, she will get an over-the-counter ankle sleeve. Return in a week and a half, and we will consider a tibiotalar injection if no better. Certainly with normal x-rays, consideration of sinus tarsi syndrome is higher in the differential which would benefit significantly from custom orthotics.

## 2013-01-03 ENCOUNTER — Ambulatory Visit (INDEPENDENT_AMBULATORY_CARE_PROVIDER_SITE_OTHER): Payer: BC Managed Care – PPO | Admitting: Sports Medicine

## 2013-01-03 ENCOUNTER — Encounter: Payer: Self-pay | Admitting: Sports Medicine

## 2013-01-03 VITALS — BP 122/79 | HR 87 | Wt 136.0 lb

## 2013-01-03 DIAGNOSIS — M25579 Pain in unspecified ankle and joints of unspecified foot: Secondary | ICD-10-CM

## 2013-01-03 DIAGNOSIS — M25572 Pain in left ankle and joints of left foot: Secondary | ICD-10-CM

## 2013-01-03 NOTE — Assessment & Plan Note (Addendum)
Persistent pain around the ankle mortise, guided talocrural injection as above. She did have some fullness just posterior to the peroneals that appeared only to be subcutaneous tissue on ultrasound. Like to see her back in a month, if continues to have pain at that point we'll get an MRI. We also discussed custom orthotics, if she continues to have pain at her next visit, he should be switched to a 30 minute slot for custom orthotics. Continue Mobic.

## 2013-01-03 NOTE — Progress Notes (Signed)
  Subjective:    CC: Followup  HPI: This pleasant 42 year old female comes back for ankle pain that she's had for a long time now. To recap, she has had physical therapy, NSAIDs, she does have partial length reviewed with her custom orthotics made by another provider. I placed her through some additional conservative measures for the past 2 weeks, as well as higher dose NSAIDs. She returns today with symptoms essentially the same, with pain over lateral talocrural joint, worse with prolonged weight-bearing. Moderate, persistent.  Past medical history, Surgical history, Family history not pertinant except as noted below, Social history, Allergies, and medications have been entered into the medical record, reviewed, and no changes needed.   Review of Systems: No fevers, chills, night sweats, weight loss, chest pain, or shortness of breath.   Objective:    General: Well Developed, well nourished, and in no acute distress.  Neuro: Alert and oriented x3, extra-ocular muscles intact, sensation grossly intact.  HEENT: Normocephalic, atraumatic, pupils equal round reactive to light, neck supple, no masses, no lymphadenopathy, thyroid nonpalpable.  Skin: Warm and dry, no rashes. Cardiac: Regular rate and rhythm, no murmurs rubs or gallops, no lower extremity edema.  Respiratory: Clear to auscultation bilaterally. Not using accessory muscles, speaking in full sentences. Left Ankle: No visible erythema or swelling. Range of motion is full in all directions. Strength is 5/5 in all directions. Stable lateral and medial ligaments; squeeze test and kleiger test unremarkable; Talar dome nontender; minimally tender to palpation over the lateral talocrural joint as well as the medial talocrural joint. No pain at base of 5th MT; No tenderness over cuboid; No tenderness over N spot or navicular prominence No tenderness on posterior aspects of lateral and medial malleolus No sign of peroneal tendon  subluxations or tenderness to palpation Negative tarsal tunnel tinel's Able to walk 4 steps.  Procedure: Real-time Ultrasound Guided Injection of left talocrural joint Device: GE Logiq E  Verbal informed consent obtained.  Time-out conducted.  Noted no overlying erythema, induration, or other signs of local infection.  Skin prepped in a sterile fashion.  Local anesthesia: Topical Ethyl chloride.  With sterile technique and under real time ultrasound guidance:  25-gauge needle advanced into the joints giving over the dorsum of the talus, just into the joint, 1 cc Kenalog 40, 2 cc lidocaine injected easily. Completed without difficulty  Pain immediately resolved suggesting accurate placement of the medication.  Advised to call if fevers/chills, erythema, induration, drainage, or persistent bleeding.  Images permanently stored and available for review in the ultrasound unit.  Impression: Technically successful ultrasound guided injection.  Ankle was then strapped with compressive dressing.  Impression and Recommendations:    '

## 2013-01-24 ENCOUNTER — Encounter: Payer: BC Managed Care – PPO | Admitting: Sports Medicine

## 2013-01-26 ENCOUNTER — Ambulatory Visit (INDEPENDENT_AMBULATORY_CARE_PROVIDER_SITE_OTHER): Payer: BC Managed Care – PPO | Admitting: Sports Medicine

## 2013-01-26 ENCOUNTER — Encounter: Payer: Self-pay | Admitting: Sports Medicine

## 2013-01-26 VITALS — BP 109/70 | HR 73 | Ht 63.0 in | Wt 134.0 lb

## 2013-01-26 DIAGNOSIS — M25579 Pain in unspecified ankle and joints of unspecified foot: Secondary | ICD-10-CM

## 2013-01-26 DIAGNOSIS — M25572 Pain in left ankle and joints of left foot: Secondary | ICD-10-CM

## 2013-01-26 NOTE — Assessment & Plan Note (Signed)
Excellent response to tibiotalar joint injection at the last visit. Pain is approximately 80% improved. Custom orthotics as above, continue home rehabilitation, continue ankle sleeve. Return to see me in 4-6 weeks on an as needed basis.

## 2013-01-26 NOTE — Progress Notes (Signed)
    Patient was fitted for a : standard, cushioned, semi-rigid orthotic. The orthotic was heated and afterward the patient stood on the orthotic blank positioned on the orthotic stand. The patient was positioned in subtalar neutral position and 10 degrees of ankle dorsiflexion in a weight bearing stance. After completion of molding, a stable base was applied to the orthotic blank. The blank was ground to a stable position for weight bearing. Size: 7 Base: Blue EVA Additional Posting and Padding: None The patient ambulated these, and they were very comfortable.  I spent 40 minutes with this patient, greater than 50% was face-to-face time counseling regarding the below diagnosis.

## 2013-01-28 ENCOUNTER — Other Ambulatory Visit: Payer: Self-pay | Admitting: Family Medicine

## 2013-02-09 ENCOUNTER — Ambulatory Visit (INDEPENDENT_AMBULATORY_CARE_PROVIDER_SITE_OTHER): Payer: BC Managed Care – PPO | Admitting: Family Medicine

## 2013-02-09 ENCOUNTER — Telehealth: Payer: Self-pay

## 2013-02-09 ENCOUNTER — Encounter: Payer: Self-pay | Admitting: Family Medicine

## 2013-02-09 VITALS — BP 114/73 | HR 78 | Ht 63.0 in | Wt 133.0 lb

## 2013-02-09 DIAGNOSIS — R110 Nausea: Secondary | ICD-10-CM

## 2013-02-09 DIAGNOSIS — R1013 Epigastric pain: Secondary | ICD-10-CM

## 2013-02-09 LAB — COMPLETE METABOLIC PANEL WITH GFR
ALBUMIN: 4.2 g/dL (ref 3.5–5.2)
ALT: 18 U/L (ref 0–35)
AST: 16 U/L (ref 0–37)
Alkaline Phosphatase: 56 U/L (ref 39–117)
BUN: 12 mg/dL (ref 6–23)
CHLORIDE: 100 meq/L (ref 96–112)
CO2: 29 mEq/L (ref 19–32)
Calcium: 9.7 mg/dL (ref 8.4–10.5)
Creat: 0.89 mg/dL (ref 0.50–1.10)
GFR, Est African American: >89 mL/min
GFR, Est Non African American: 80 mL/min
Glucose, Bld: 84 mg/dL (ref 70–99)
POTASSIUM: 4 meq/L (ref 3.5–5.3)
Sodium: 138 mEq/L (ref 135–145)
Total Bilirubin: 0.4 mg/dL (ref 0.2–1.2)
Total Protein: 6.7 g/dL (ref 6.0–8.3)

## 2013-02-09 LAB — LIPASE: LIPASE: 25 U/L (ref 0–75)

## 2013-02-09 LAB — TSH: TSH: 0.974 u[IU]/mL (ref 0.350–4.500)

## 2013-02-09 LAB — GAMMA GT: GGT: 18 U/L (ref 7–51)

## 2013-02-09 MED ORDER — ONDANSETRON HCL 4 MG PO TABS: 4.0000 mg | ORAL_TABLET | Freq: Three times a day (TID) | ORAL | Status: DC | PRN

## 2013-02-09 NOTE — Telephone Encounter (Signed)
error 

## 2013-02-09 NOTE — Progress Notes (Signed)
CC: Monique May is a 43 y.o. female is here for Nausea   Subjective: HPI:  Complains of nausea that has been present for the past 4 weeks to at least a mild degree on a daily basis and fluctuates throughout the day mild to moderate severity. Is described as "nausea in my throat" and decreased appetite. Accompanied by abdominal pain described only has pain mild in severity worse 30 minutes after eating any particular food. She has tried to treat the above with eating more bland food for all 3 meals which does not seem to be helping or worsening. Abdomen is nonradiating. Symptoms can occur anytime of the day. All of this started the day after she drank too much alcohol on New Year's Eve. Since then she has not had any alcohol consumption whatsoever.  She denies vomiting, diarrhea but does endorse constipation since starting Pepto-Bismol for the above symptoms which has not helped.  She is using Nexium in the past for similar abdominal pain late 2014 which somewhat helped however is not taking it now. Denies fevers, chills, vomiting, back pain, right upper quadrant pain, diarrhea, blood in stool, flank pain, urinary symptoms, shortness of breath chest pain or rash.  Review of systems positive for nasal congestion   Review Of Systems Outlined In HPI  Past Medical History  Diagnosis Date  . Endometriosis   . Tubal pregnancy   . Depression   . Allergy   . Asthma     Past Surgical History  Procedure Laterality Date  . Exploratory laparotomy      endometriosis  . Appendectomy    . Tonsillectomy    . Rhinoplasty     Family History  Problem Relation Age of Onset  . Obesity Mother   . Diabetes Mother   . Cirrhosis Father     Hep C, deceased after liver transplant  . Cerebral palsy Sister   . Drug abuse Brother   . Skin cancer Mother   . Hyperlipidemia Mother   . Psychosis Sister   . Bipolar disorder Brother     History   Social History  . Marital Status: Married    Spouse Name: N/A     Number of Children: 0  . 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 Smoking status: Never Smoker   . Smokeless tobacco: Not on file  . Alcohol Use: 8.4 oz/week    14 Glasses of wine per week  . Drug Use: Not on file  . Sexual Activity: Yes    Partners: Male    Birth Control/ Protection: Condom     Comment: Works for [employer], married, no kids, runs, good diet.   Other Topics Concern  . Not on file   Social History Narrative   Regular exercise. Works at [employer]     Objective: BP 114/73  Pulse 78  Ht 5\' 3"  (1.6 m)  Wt 133 lb (60.328 kg)  BMI 23.57 kg/m2  General: Alert and Oriented, No Acute Distress HEENT: Pupils equal, round, reactive to light. Conjunctivae clear.  External ears unremarkable, canals clear with intact TMs with appropriate landmarks.  Middle ear appears open without effusion. Pink inferior turbinates.  Moist mucous membranes, pharynx without inflammation nor lesions.  Neck supple without palpable lymphadenopathy nor abnormal masses. Lungs: Clear to auscultation bilaterally, no wheezing/ronchi/rales.  Comfortable work of breathing. Good air movement. Cardiac: Regular rate and rhythm. Normal S1/S2.  No murmurs, rubs, nor gallops.   Abdomen: Normal bowel sounds, soft without rebound nor guarding. Pain is slightly reproduced with palpation of right upper quadrant, epigastric palpation reproduces pain in a "fullness"sensation. No probable masses in any quadrant. Extremities: No peripheral edema.  Strong peripheral pulses.  Mental Status: No depression, anxiety, nor agitation. Skin: Warm and dry.  Assessment & Plan: [place] was seen today for nausea.  Diagnoses and associated orders for this visit:  Nausea alone - TSH - COMPLETE METABOLIC PANEL WITH GFR - Lipase - Gamma GT - ondansetron (ZOFRAN) 4 MG tablet; Take 1 tablet (4 mg total) by mouth every 8 (eight) hours as needed for  nausea or vomiting.  Abdominal pain, epigastric - TSH - COMPLETE METABOLIC PANEL WITH GFR - Lipase - Gamma GT     Nausea with epigastric discomfort: Given Mobic use for the weeks prior to onset of pain suspect that this could be due to gastritis however differential remains broad. Obtain labs above to cut down on differential. She was given Zofran 4 symptomatic control pending the above results.   Return if symptoms worsen or fail to improve.

## 2013-02-10 ENCOUNTER — Telehealth: Payer: Self-pay | Admitting: Family Medicine

## 2013-02-10 DIAGNOSIS — R110 Nausea: Secondary | ICD-10-CM

## 2013-02-10 NOTE — Telephone Encounter (Signed)
Pt.notified

## 2013-02-10 NOTE — Telephone Encounter (Signed)
Seth Bake, Will you please let Mrs. Anstine know that all of her labs yesterday were normal which is good news but still doesn't explain her nausea.  Since my suspicion of gastritis or an ulcer is high I'd encourage her to start OTC ranitidine 150mg  twice a day.  I've also placed a referral to GI.  In the meantime if nausea is uncontrolled with ondansetron please let me know as there are alternatives available.

## 2013-02-17 ENCOUNTER — Encounter: Payer: Self-pay | Admitting: Gastroenterology

## 2013-02-28 ENCOUNTER — Other Ambulatory Visit: Payer: Self-pay | Admitting: *Deleted

## 2013-02-28 ENCOUNTER — Other Ambulatory Visit: Payer: Self-pay | Admitting: Family Medicine

## 2013-03-11 ENCOUNTER — Other Ambulatory Visit: Payer: Self-pay | Admitting: Family Medicine

## 2013-03-14 ENCOUNTER — Other Ambulatory Visit: Payer: Self-pay | Admitting: *Deleted

## 2013-03-14 MED ORDER — ALBUTEROL SULFATE HFA 108 (90 BASE) MCG/ACT IN AERS: 2.0000 | INHALATION_SPRAY | Freq: Four times a day (QID) | RESPIRATORY_TRACT | Status: DC | PRN

## 2013-03-19 ENCOUNTER — Other Ambulatory Visit: Payer: Self-pay | Admitting: Family Medicine

## 2013-03-22 ENCOUNTER — Ambulatory Visit: Payer: BC Managed Care – PPO | Admitting: Gastroenterology

## 2013-03-29 ENCOUNTER — Encounter: Payer: Self-pay | Admitting: Gastroenterology

## 2013-03-29 ENCOUNTER — Ambulatory Visit (INDEPENDENT_AMBULATORY_CARE_PROVIDER_SITE_OTHER): Payer: BC Managed Care – PPO | Admitting: Gastroenterology

## 2013-03-29 VITALS — BP 110/72 | HR 72 | Ht 63.0 in | Wt 136.0 lb

## 2013-03-29 DIAGNOSIS — K5900 Constipation, unspecified: Secondary | ICD-10-CM

## 2013-03-29 DIAGNOSIS — R110 Nausea: Secondary | ICD-10-CM

## 2013-03-29 NOTE — Patient Instructions (Signed)
Stop amitiza. Samples of linzess given, take one pill once daily.  Call in 10 days to report on your symptoms, script can be called in then. Change ranitide to one pill at bedtime. Start OTC omeprazole, or OTC nexium.  Take one pill 20 -30 min before BF meal. Please return to see Dr. Ardis Hughs in 4-5 weeks, if still with significant symptoms would consider EGD.

## 2013-03-29 NOTE — Progress Notes (Signed)
HPI: This is a   very pleasant 43 year old woman whom I am meeting for the first time today.  Complete metabolic profile, thyroid testing last month was normal.   For many years, she's had issues. (since a child).  Colonoscopy about 20 years ago; she was told she had a very redundant colon, signficant constipation.  Was offered colectomy.  She has abdominal pains. Bilateral, perumbilical; started shortly after starting mobic.  Stopped mobic.  Also began getting nauseas, not vomiting.  Started a probiotic.  Some   Takes ranitidine, does not prevent acid.  Bothered with pyrosis about every day.  She eats very little gluten.  Takes amitiza for about a year.     Overall her weight is up 10 pounds in last year.  Has nausea.   Review of systems: Pertinent positive and negative review of systems were noted in the above HPI section. Complete review of systems was performed and was otherwise normal.    Past Medical History  Diagnosis Date  . Endometriosis   . Tubal pregnancy   . Depression   . Allergy   . Asthma     Past Surgical History  Procedure Laterality Date  . Exploratory laparotomy      endometriosis  . Appendectomy    . Tonsillectomy    . Rhinoplasty      Current Outpatient Prescriptions  Medication Sig Dispense Refill  . albuterol (PROVENTIL HFA;VENTOLIN HFA) 108 (90 BASE) MCG/ACT inhaler Inhale 2 puffs into the lungs every 6 (six) hours as needed.  1 Inhaler  1  . AMITIZA 8 MCG capsule Take 1 tablet by mouth twice daily with meals  60 capsule  1  . buPROPion (WELLBUTRIN XL) 300 MG 24 hr tablet TAKE ONE TABLET BY MOUTH EVERY MORNING   30 tablet  1  . escitalopram (LEXAPRO) 20 MG tablet TAKE ONE TABLET BY MOUTH ONE TIME DAILY      . LO LOESTRIN FE 1 MG-10 MCG / 10 MCG tablet Take one tablet by mouth daily ,skipping placebo pills as directed  28 tablet  7  . meloxicam (MOBIC) 15 MG tablet One tab PO qAM with breakfast for 2 weeks, then daily prn pain.  30 tablet  3   . ondansetron (ZOFRAN) 4 MG tablet Take 1 tablet (4 mg total) by mouth every 8 (eight) hours as needed for nausea or vomiting.  30 tablet  0  . traMADol (ULTRAM) 50 MG tablet Take 1 tablet (50 mg total) by mouth every 8 (eight) hours as needed for pain.  30 tablet  0   No current facility-administered medications for this visit.    Allergies as of 03/29/2013 - Review Complete 03/29/2013  Allergen Reaction Noted  . Erythromycin Rash     Family History  Problem Relation Age of Onset  . Obesity Mother   . Diabetes Mother   . Cirrhosis Father     Hep C, deceased after liver transplant  . Cerebral palsy Sister   . Drug abuse Brother   . Skin cancer Mother   . Hyperlipidemia Mother   . Psychosis Sister   . Bipolar disorder Brother     History   Social History  . Marital Status: Married    Spouse Name: N/A    Number of Children: 0  . 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 Smoking status: Never Smoker   . Smokeless tobacco: Not  on file  . Alcohol Use: 8.4 oz/week    14 Glasses of wine per week  . Drug Use: Not on file  . Sexual Activity: Yes    Partners: Male    Birth Control/ Protection: Condom     Comment: Works for [employer], married, no kids, runs, good diet.   Other Topics Concern  . Not on file   Social History Narrative   Regular exercise. Works at [employer]       Physical Exam: BP 110/72  Pulse 72  Ht 5\' 3"  (1.6 m)  Wt 136 lb (61.689 kg)  BMI 24.10 kg/m2 Constitutional: generally well-appearing Psychiatric: alert and oriented x3 Eyes: extraocular movements intact Mouth: oral pharynx moist, no lesions Neck: supple no lymphadenopathy Cardiovascular: heart regular rate and rhythm Lungs: clear to auscultation bilaterally Abdomen: soft, nontender, nondistended, no obvious ascites, no peritoneal signs, normal bowel sounds Extremities: no lower extremity edema  bilaterally Skin: no lesions on visible extremities    Assessment and plan: 43 y.o. female with  nausea, chronic constipation, GERD; no alarm symptoms  She takes amitiza twice daily and it really doesn't seem to be too effective for her since she only has bowel movements about twice per week. I'm going to change her to once daily samples of linzess, she will call to report on her response in 7-10 days and a prescription will be written if needed. She has GERD symptoms, nausea. She is only taking H2 blocker and I'm going to increase her to proton pump inhibitor every morning before breakfast. She will continue taking H2 blocker at night, bedtime. She will return to see me in 4-5 weeks and sooner if needed. If she is still bothered by significant upper or lower GI symptoms then I would consider endoscopic testing at that point.

## 2013-04-05 ENCOUNTER — Telehealth: Payer: Self-pay | Admitting: Gastroenterology

## 2013-04-05 MED ORDER — LINACLOTIDE 145 MCG PO CAPS: 145.0000 ug | ORAL_CAPSULE | Freq: Every day | ORAL | Status: DC

## 2013-04-05 NOTE — Telephone Encounter (Signed)
rx sent as requested.

## 2013-05-14 ENCOUNTER — Other Ambulatory Visit: Payer: Self-pay | Admitting: Family Medicine

## 2013-05-23 ENCOUNTER — Other Ambulatory Visit: Payer: Self-pay | Admitting: Family Medicine

## 2013-05-24 ENCOUNTER — Ambulatory Visit: Payer: BC Managed Care – PPO | Admitting: Gastroenterology

## 2013-05-26 ENCOUNTER — Ambulatory Visit (INDEPENDENT_AMBULATORY_CARE_PROVIDER_SITE_OTHER): Payer: BC Managed Care – PPO | Admitting: Family Medicine

## 2013-05-26 ENCOUNTER — Encounter: Payer: Self-pay | Admitting: Family Medicine

## 2013-05-26 VITALS — BP 107/69 | HR 78 | Ht 63.0 in | Wt 138.0 lb

## 2013-05-26 DIAGNOSIS — F32A Depression, unspecified: Secondary | ICD-10-CM

## 2013-05-26 DIAGNOSIS — K589 Irritable bowel syndrome without diarrhea: Secondary | ICD-10-CM

## 2013-05-26 DIAGNOSIS — F3289 Other specified depressive episodes: Secondary | ICD-10-CM

## 2013-05-26 DIAGNOSIS — F329 Major depressive disorder, single episode, unspecified: Secondary | ICD-10-CM

## 2013-05-26 DIAGNOSIS — R635 Abnormal weight gain: Secondary | ICD-10-CM

## 2013-05-26 MED ORDER — PHENTERMINE HCL 15 MG PO CAPS
15.0000 mg | ORAL_CAPSULE | ORAL | Status: DC
Start: 2013-05-26 — End: 2013-06-16

## 2013-05-26 MED ORDER — BUPROPION HCL ER (XL) 300 MG PO TB24: ORAL_TABLET | ORAL | Status: DC

## 2013-05-26 MED ORDER — BUPROPION HCL ER (XL) 150 MG PO TB24: ORAL_TABLET | ORAL | Status: DC

## 2013-05-26 NOTE — Progress Notes (Signed)
   Subjective:    Patient ID: Monique May, female    DOB: 13-May-1970, 43 y.o.   MRN: [id number]  HPI 6 mo f/U depression- She is dong well on her current regimen of citalopram and wellbutrin.  Has bene runnign for exercise and this is greatly helped her mood. Back she think she would like to decrease her Wellbutrin and see how well she does. She was on 150 mg dose for a while until about last fall.  Irritable bowel syndrome-she's been very happy with the Linzess. Has been working very well.  Weight gain-she used to weigh 125 pounds when she was running regularly. She's now been running for a month and has been restricting her calories to between 1213 100 calories per day and has been unable to lose weight. She would really like to lose about 8 pounds and get down to about 130. She has been counting her calories and keeping a log at home. She's been doing this for over a month as well. We checked her thyroid in February and it was normal.   Review of Systems     Objective:   Physical Exam  Constitutional: She is oriented to person, place, and time. She appears well-developed and well-nourished.  HENT:  Head: Normocephalic and atraumatic.  Cardiovascular: Normal rate, regular rhythm and normal heart sounds.   Pulmonary/Chest: Effort normal and breath sounds normal.  Neurological: She is alert and oriented to person, place, and time.  Skin: Skin is warm and dry.  Psychiatric: She has a normal mood and affect. Her behavior is normal.          Assessment & Plan:  Depression-well-controlled on current regimen. We'll decrease Wellbutrin 250 mg. I like to see her back in 6 months. Certainly we can consider decreasing the medication again if she's doing well.  Irritable bowel syndrome-currently well controlled on Linzess.  Abnormal weight gain-we discussed different options. She is Re: calorie counting, exercising. Her thyroid is normal. We could consider nutrition referral. Also discussed  weight loss medication as an option. Will use phentermine which she has taken in the past but will use a low dose, 15 mg instead of 37.5 since her BMI is only 24. Will need followup in one month for blood pressure weight check. She's had no prior history of heart disease. She's to stop immediately if she exhibits any chest pain, short of breath, palpitations. Continue with exercise and diet plan.

## 2013-06-16 ENCOUNTER — Ambulatory Visit (INDEPENDENT_AMBULATORY_CARE_PROVIDER_SITE_OTHER): Payer: BC Managed Care – PPO | Admitting: Family Medicine

## 2013-06-16 ENCOUNTER — Encounter: Payer: Self-pay | Admitting: Family Medicine

## 2013-06-16 VITALS — BP 107/66 | HR 80 | Wt 135.0 lb

## 2013-06-16 DIAGNOSIS — R635 Abnormal weight gain: Secondary | ICD-10-CM

## 2013-06-16 MED ORDER — PHENTERMINE HCL 15 MG PO CAPS: 15.0000 mg | ORAL_CAPSULE | ORAL | Status: DC

## 2013-06-16 NOTE — Progress Notes (Signed)
   Subjective:    Patient ID: Monique May, female    DOB: 1970-09-22, 43 y.o.   MRN: [id number]  HPI    Review of Systems     Objective:   Physical Exam        Assessment & Plan:

## 2013-06-16 NOTE — Progress Notes (Signed)
Patient doing well on appetite suppressant. Here for nurse visit, weight, BP, HR check. Denies problems with insomnia, heart palpitations or tremors. Satisfied with weight loss thus far and is working on healthy diet and regular exercise. .Monique May [place]   

## 2013-08-29 ENCOUNTER — Telehealth: Payer: Self-pay | Admitting: Family Medicine

## 2013-08-29 DIAGNOSIS — Z1211 Encounter for screening for malignant neoplasm of colon: Secondary | ICD-10-CM

## 2013-08-29 NOTE — Telephone Encounter (Signed)
Pt called. She would like to see a specialist for Hemmorhoids and colonoscopy.  Thank you

## 2013-08-29 NOTE — Telephone Encounter (Signed)
Please let patient know I place a referral. She should hopefully be getting a call within the next week.

## 2013-09-19 ENCOUNTER — Telehealth: Payer: Self-pay | Admitting: *Deleted

## 2013-09-19 DIAGNOSIS — K589 Irritable bowel syndrome without diarrhea: Secondary | ICD-10-CM

## 2013-09-19 NOTE — Telephone Encounter (Signed)
Pt called requesting to get testing for celiac dz. Order placed and faxed to lab.Audelia Hives [place]

## 2013-09-21 LAB — CELIAC PANEL 10
Endomysial Screen: NEGATIVE
Gliadin IgA: 3.3 U/mL (ref <20)
Gliadin IgG: 12.5 U/mL (ref <20)
IgA: 73 mg/dL (ref 69–380)
TISSUE TRANSGLUTAMINASE AB, IGA: 1.9 U/mL (ref <20)
Tissue Transglut Ab: 2 U/mL (ref <20)

## 2013-09-26 ENCOUNTER — Other Ambulatory Visit: Payer: Self-pay | Admitting: Family Medicine

## 2013-10-25 ENCOUNTER — Other Ambulatory Visit: Payer: Self-pay | Admitting: Family Medicine

## 2013-10-30 IMAGING — CR DG CHEST 2V
2 series · 2 of 2 positions shown · non-contrast
Comparison: None.

CLINICAL DATA: Persistent cough.

CHEST - 2 VIEW

[view not recorded (1 of 2)]
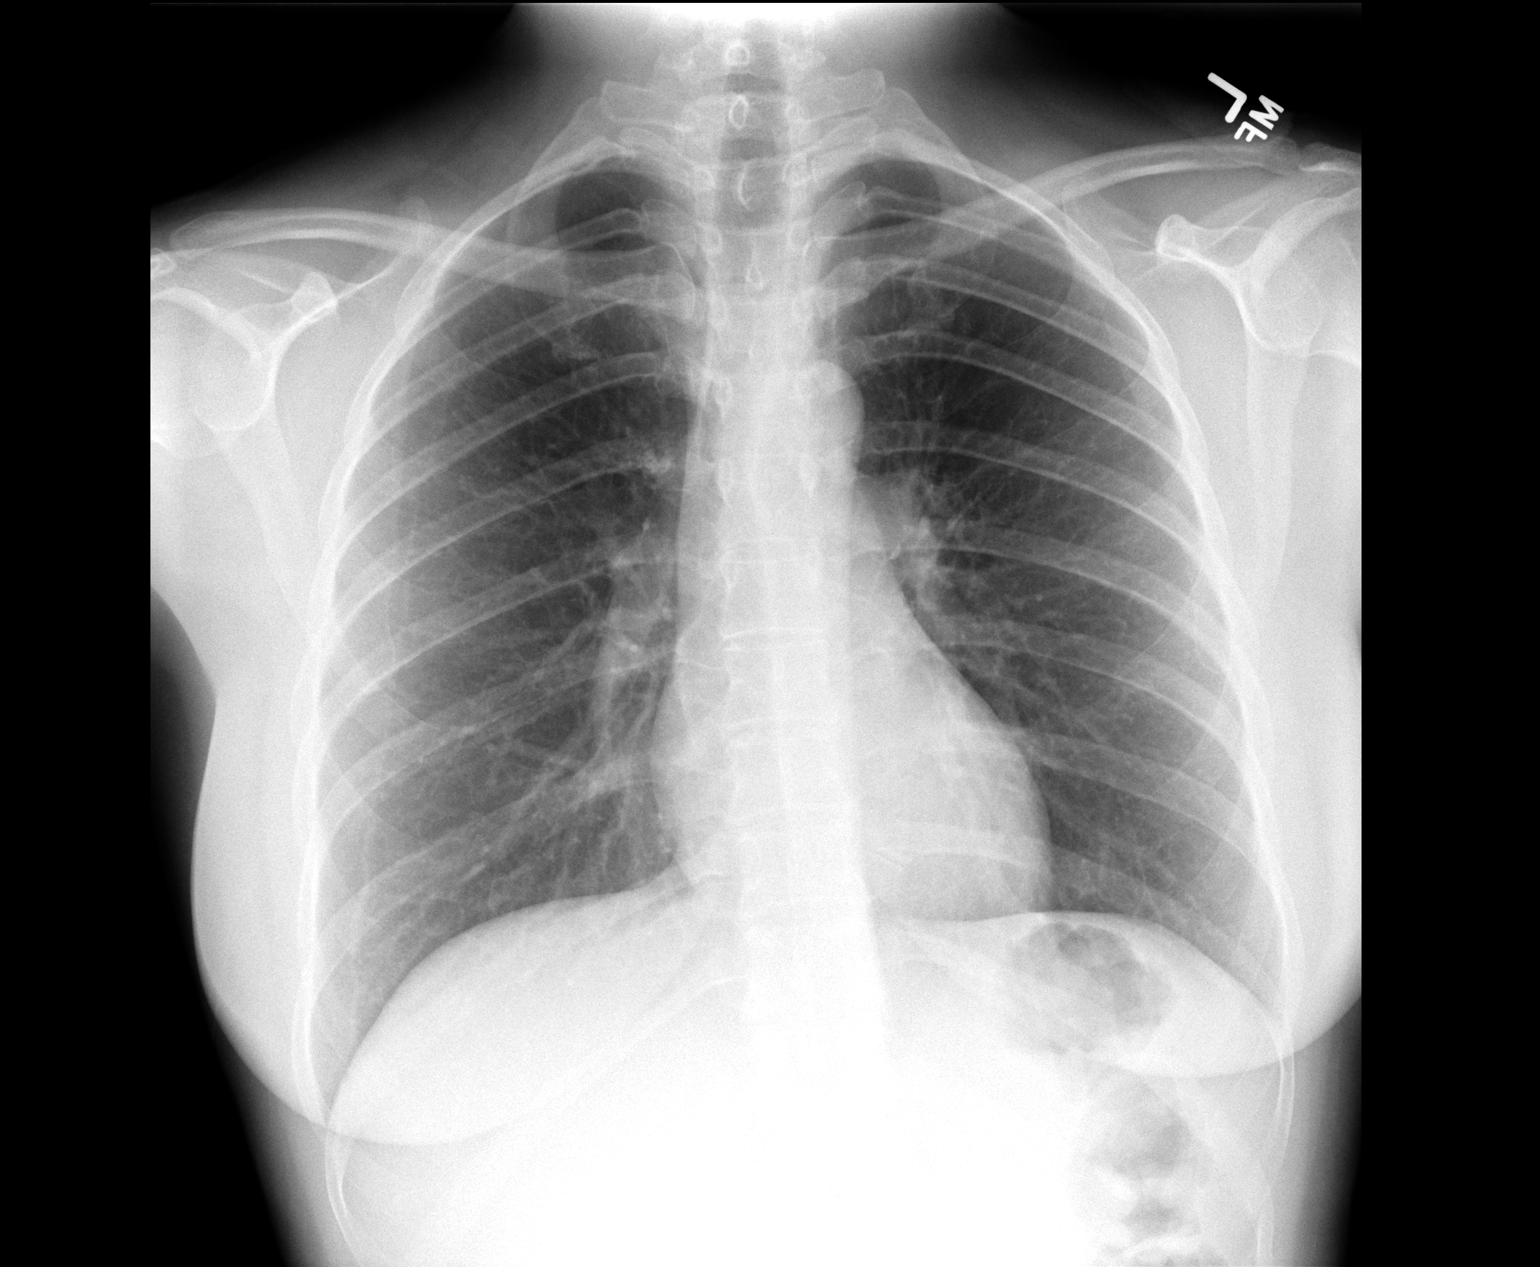

[view not recorded (2 of 2)]
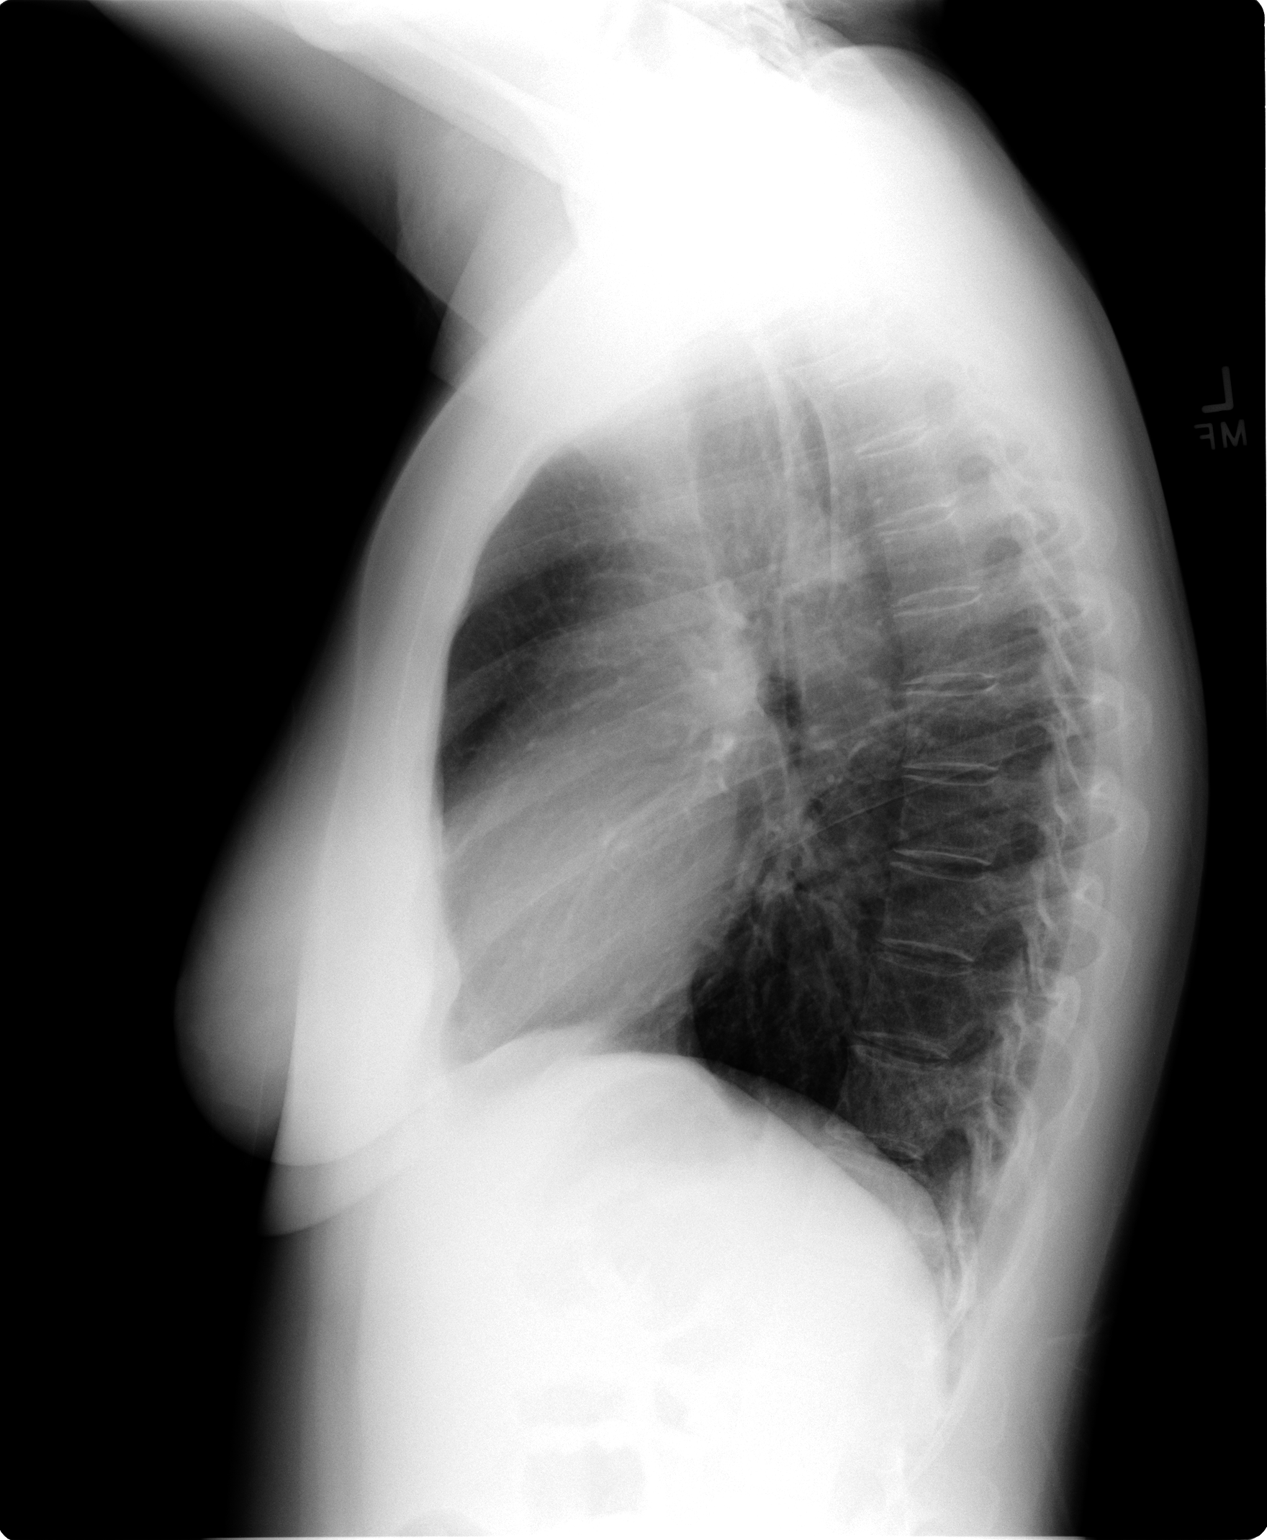

[2 of 2 positions shown; findings below may reference images not displayed]

FINDINGS: Heart size and pulmonary vascularity are normal and the
lungs are clear.  No significant osseous abnormality.
IMPRESSION: Normal chest.

## 2013-12-08 ENCOUNTER — Ambulatory Visit (INDEPENDENT_AMBULATORY_CARE_PROVIDER_SITE_OTHER): Payer: BC Managed Care – PPO | Admitting: Family Medicine

## 2013-12-08 ENCOUNTER — Encounter: Payer: Self-pay | Admitting: Family Medicine

## 2013-12-08 VITALS — BP 114/62 | HR 65 | Wt 129.0 lb

## 2013-12-08 DIAGNOSIS — F418 Other specified anxiety disorders: Secondary | ICD-10-CM | POA: Diagnosis not present

## 2013-12-08 MED ORDER — BUPROPION HCL ER (XL) 300 MG PO TB24: 300.0000 mg | ORAL_TABLET | Freq: Every day | ORAL | Status: DC

## 2013-12-08 MED ORDER — ESCITALOPRAM OXALATE 10 MG PO TABS: 10.0000 mg | ORAL_TABLET | Freq: Every day | ORAL | Status: DC

## 2013-12-08 NOTE — Progress Notes (Signed)
   Subjective:    Patient ID: Monique May, female    DOB: 04-09-1970, 43 y.o.   MRN: [id number]  HPI  here to discuss mood. Her husband had encouraged her to come in today. She complete feeling nervous, on edge nearly every day and not being up to control her worry. Also complains of irritability nearly every day. She says she feels down depressed or hopeless more than half the days and has difficulty with sleep and low energy and difficulty concentrating. Has been having some Brain "zaps" and having more mental sluggishness.  She says she tends to feel little bit more down this time of year anyway. She denies any acute stressors. Just usual life things.   Review of Systems     Objective:   Physical Exam  Constitutional: She is oriented to person, place, and time. She appears well-developed and well-nourished.  HENT:  Head: Normocephalic and atraumatic.  Cardiovascular: Normal rate, regular rhythm and normal heart sounds.   Pulmonary/Chest: Effort normal and breath sounds normal.  Neurological: She is alert and oriented to person, place, and time.  Skin: Skin is warm and dry.  Psychiatric: She has a normal mood and affect. Her behavior is normal.          Assessment & Plan:  Depression/Anxiety -  Uncontrolled. PHQ 9 score of 12 today , previous was 1. Gad 7 score of 12 today. She rates her symptoms is somewhat difficult. We discussed options. I would like to decrease her Lexapro to 10 mg since she's getting the brain that being sensation. And we will increase her Wellbutrin to 300 mg. New prescription sent. Follow-up in 6 weeks. Also discussed considering a trial of light therapy since she does tend to have a seasonal drop in her mood in the wintertime.

## 2013-12-16 ENCOUNTER — Other Ambulatory Visit: Payer: Self-pay | Admitting: Family Medicine

## 2014-02-15 ENCOUNTER — Encounter: Payer: Self-pay | Admitting: Family Medicine

## 2014-02-15 ENCOUNTER — Ambulatory Visit (INDEPENDENT_AMBULATORY_CARE_PROVIDER_SITE_OTHER): Payer: BLUE CROSS/BLUE SHIELD | Admitting: Family Medicine

## 2014-02-15 VITALS — BP 102/68 | HR 66

## 2014-02-15 DIAGNOSIS — Z91018 Allergy to other foods: Secondary | ICD-10-CM

## 2014-02-15 DIAGNOSIS — R0789 Other chest pain: Secondary | ICD-10-CM | POA: Diagnosis not present

## 2014-02-15 DIAGNOSIS — R002 Palpitations: Secondary | ICD-10-CM | POA: Diagnosis not present

## 2014-02-15 DIAGNOSIS — F418 Other specified anxiety disorders: Secondary | ICD-10-CM

## 2014-02-15 MED ORDER — EPINEPHRINE 0.3 MG/0.3ML IJ SOAJ: 0.3000 mg | Freq: Once | INTRAMUSCULAR | Status: AC

## 2014-02-15 NOTE — Progress Notes (Signed)
   Subjective:    Patient ID: Monique May, female    DOB: 1970-02-28, 44 y.o.   MRN: [id number]  HPI Depression -  She feels he is doing well. She has been on lexapro and wellbutrin. We increased her Wellbutrin dose about 8 weeks ago. She has been having tightness in her chest almost daily for 3 week. Says feeling her heart race when it happens.  Usually last about 10 min. most often happens at rest. Never happens when she's exercising or running. Cut back on her caffeine butr didn't make a difference.    She was more stressed a month ago but tha tis better. Sleeping well.  No GERD.  She feels like she sleeping well overall.  About a week ago thiks had a reaction to pineapple. Says how mouth burned and turned red. Says her mouth and tongue were sore for days.  She would like to get an epipen. No pain with swallowing but did have a mild ST.  No SOB.    Review of Systems     Objective:   Physical Exam  Constitutional: She is oriented to person, place, and time. She appears well-developed and well-nourished.  HENT:  Head: Normocephalic and atraumatic.  Cardiovascular: Normal rate, regular rhythm and normal heart sounds.   Pulmonary/Chest: Effort normal and breath sounds normal. No respiratory distress. She exhibits no tenderness.  Neurological: She is alert and oriented to person, place, and time.  Skin: Skin is warm and dry.  Psychiatric: She has a normal mood and affect. Her behavior is normal.         Assessment & Plan:  Depression - PHQ-9 scorre of 6.  Previous score of 12 services about a 50% reduction in symptoms after increasing her Wellbutrin about 8 weeks ago.  Atypical chest pain-unlikely to be cardiac since it happens most often at rest.  Most likely related to mood that her symptom scores actually seems somewhat improved. I really don't think this is a side effect of the Wellbutrin since her symptoms started 3 weeks ago and we increased her dose about 8 weeks ago. She denies  having any heartburn type symptoms. EKG shows normal sinus rhythm with a rate of 65 bpm, normal axis with no acute ST-T wave changes. She is due for up-to-date blood work and we will add a thyroid and a CBC today as well. If this is all normal then consider treatment with a PPI for trial for 14 days given that she's not having any specific reflux type symptoms but sometimes chest discomfort can come from reflux.  Pineapple allergy - will add to allergy list.  Will rx an epi=pen.

## 2014-03-04 LAB — COMPLETE METABOLIC PANEL WITHOUT GFR
ALT: 12 U/L (ref 0–35)
AST: 13 U/L (ref 0–37)
Albumin: 3.6 g/dL (ref 3.5–5.2)
Alkaline Phosphatase: 87 U/L (ref 39–117)
BUN: 13 mg/dL (ref 6–23)
CO2: 34 meq/L — ABNORMAL HIGH (ref 19–32)
Calcium: 9.2 mg/dL (ref 8.4–10.5)
Chloride: 100 meq/L (ref 96–112)
Creat: 0.84 mg/dL (ref 0.50–1.10)
GFR, Est African American: >89 mL/min
GFR, Est Non African American: 85 mL/min
Glucose, Bld: 76 mg/dL (ref 70–99)
Potassium: 4.5 meq/L (ref 3.5–5.3)
Sodium: 137 meq/L (ref 135–145)
Total Bilirubin: 0.4 mg/dL (ref 0.2–1.2)
Total Protein: 6.4 g/dL (ref 6.0–8.3)

## 2014-03-04 LAB — LIPID PANEL
Cholesterol: 193 mg/dL (ref 0–200)
HDL: 71 mg/dL
LDL Cholesterol: 105 mg/dL — ABNORMAL HIGH (ref 0–99)
Total CHOL/HDL Ratio: 2.7 ratio
Triglycerides: 86 mg/dL
VLDL: 17 mg/dL (ref 0–40)

## 2014-03-04 LAB — CBC
HCT: 41 % (ref 36.0–46.0)
Hemoglobin: 13.3 g/dL (ref 12.0–15.0)
MCH: 27.1 pg (ref 26.0–34.0)
MCHC: 32.4 g/dL (ref 30.0–36.0)
MCV: 83.7 fL (ref 78.0–100.0)
MPV: 8.9 fL (ref 8.6–12.4)
Platelets: 334 K/uL (ref 150–400)
RBC: 4.9 MIL/uL (ref 3.87–5.11)
RDW: 13.5 % (ref 11.5–15.5)
WBC: 7.7 K/uL (ref 4.0–10.5)

## 2014-03-04 LAB — FERRITIN: FERRITIN: 144 ng/mL (ref 10–291)

## 2014-03-04 LAB — TSH: TSH: 1.402 u[IU]/mL (ref 0.350–4.500)

## 2014-03-28 ENCOUNTER — Other Ambulatory Visit: Payer: Self-pay | Admitting: Family Medicine

## 2014-03-28 ENCOUNTER — Ambulatory Visit (INDEPENDENT_AMBULATORY_CARE_PROVIDER_SITE_OTHER): Payer: BLUE CROSS/BLUE SHIELD

## 2014-03-28 ENCOUNTER — Ambulatory Visit (INDEPENDENT_AMBULATORY_CARE_PROVIDER_SITE_OTHER): Payer: BLUE CROSS/BLUE SHIELD | Admitting: Family Medicine

## 2014-03-28 ENCOUNTER — Encounter: Payer: Self-pay | Admitting: Family Medicine

## 2014-03-28 VITALS — BP 109/66 | HR 68 | Ht 63.0 in | Wt 132.0 lb

## 2014-03-28 DIAGNOSIS — M79645 Pain in left finger(s): Secondary | ICD-10-CM

## 2014-03-28 DIAGNOSIS — Z1231 Encounter for screening mammogram for malignant neoplasm of breast: Secondary | ICD-10-CM | POA: Diagnosis not present

## 2014-03-28 NOTE — Progress Notes (Signed)
   Subjective:    Patient ID: Monique May, female    DOB: 03/31/1970, 44 y.o.   MRN: [id number]  HPI Left thumb pain x 3 weeks. She has felt dec strength. 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 No acute injury. She is right handed. Pain is 2/10.  She has iced it.  Using IBU and ordered a brace from [employer] but hasn't gotten it in yet.  It looks like a thumb spica splint. She does remember having a fall a few weeks ago but doesn't remember it necessarily relating to the thumb pain or not.   Review of Systems     Objective:   Physical Exam  Left thumb appears normal in shape and size. No swelling or deformity. She has good strength in all directions and normal range of motion. No pain with range of motion to suggest a tendinitis. No tenderness over the anatomic snuffbox. She is tender between the proximal ends of the first and second metacarpals in her left hand.      Assessment & Plan:  Left thumb pain - since she has pinpoint tenderness at the base of the left thumb near the wrist near the head of the first metacarpal joint. I will going to get an x-ray to rule out fracture. Nontender over the anatomic snuffbox. We'll call with results once available. If negative then she might be able to wear the splint for a week or 2 to try to rest the joint and see if that's helpful.   Due for screening mammogram.

## 2014-03-30 ENCOUNTER — Ambulatory Visit (INDEPENDENT_AMBULATORY_CARE_PROVIDER_SITE_OTHER): Payer: BLUE CROSS/BLUE SHIELD

## 2014-03-30 DIAGNOSIS — Z1231 Encounter for screening mammogram for malignant neoplasm of breast: Secondary | ICD-10-CM | POA: Diagnosis not present

## 2014-04-04 ENCOUNTER — Other Ambulatory Visit: Payer: Self-pay

## 2014-04-04 ENCOUNTER — Other Ambulatory Visit: Payer: Self-pay | Admitting: Family Medicine

## 2014-04-04 MED ORDER — NORETHIN-ETH ESTRAD-FE BIPHAS 1 MG-10 MCG / 10 MCG PO TABS: ORAL_TABLET | ORAL | Status: DC

## 2014-04-04 MED ORDER — BUPROPION HCL ER (XL) 300 MG PO TB24: 300.0000 mg | ORAL_TABLET | Freq: Every day | ORAL | Status: DC

## 2014-04-04 MED ORDER — LINACLOTIDE 145 MCG PO CAPS: 145.0000 ug | ORAL_CAPSULE | Freq: Every day | ORAL | Status: DC

## 2014-04-04 MED ORDER — ESCITALOPRAM OXALATE 10 MG PO TABS: 10.0000 mg | ORAL_TABLET | Freq: Every day | ORAL | Status: DC

## 2014-04-05 ENCOUNTER — Other Ambulatory Visit: Payer: Self-pay | Admitting: *Deleted

## 2014-04-05 MED ORDER — ESCITALOPRAM OXALATE 10 MG PO TABS: 10.0000 mg | ORAL_TABLET | Freq: Every day | ORAL | Status: DC

## 2014-04-05 MED ORDER — NORETHIN-ETH ESTRAD-FE BIPHAS 1 MG-10 MCG / 10 MCG PO TABS: ORAL_TABLET | ORAL | Status: DC

## 2014-06-22 ENCOUNTER — Telehealth: Payer: Self-pay | Admitting: *Deleted

## 2014-06-22 NOTE — Telephone Encounter (Signed)
Pt called and stated that she took her medication 2x yesterday by accident. She felt jittery all day yesterday but feels fine today. I advised at this time that she not take anything and that I would discuss this with Dr. Madilyn Fireman and call her back with her recommendations. Monique May, Lahoma Crocker

## 2014-06-23 NOTE — Telephone Encounter (Signed)
She should be okay. Doubling up on the Lexapro should not cause any problems. She did take a very high dose of Wellbutrin that that's probably why she feels a little off. She should feel better in the next couple days. Just make sure hydrating well.

## 2014-06-23 NOTE — Telephone Encounter (Signed)
Called pt and lvm informing her of recommendations. Told to call back if she has any questions.Monique May, Monique May

## 2014-07-03 ENCOUNTER — Other Ambulatory Visit: Payer: Self-pay | Admitting: Family Medicine

## 2014-07-11 ENCOUNTER — Encounter: Payer: Self-pay | Admitting: *Deleted

## 2014-07-11 ENCOUNTER — Emergency Department
Admission: EM | Admit: 2014-07-11 | Discharge: 2014-07-11 | Disposition: A | Discharge status: Home / Self Care | Payer: BLUE CROSS/BLUE SHIELD | Source: Home / Self Care | Attending: Emergency Medicine | Admitting: Emergency Medicine

## 2014-07-11 DIAGNOSIS — R300 Dysuria: Secondary | ICD-10-CM

## 2014-07-11 LAB — POCT URINALYSIS DIP (MANUAL ENTRY)
Bilirubin, UA: NEGATIVE
Glucose, UA: NEGATIVE
Ketones, POC UA: NEGATIVE
Leukocytes, UA: NEGATIVE
Nitrite, UA: NEGATIVE
Protein Ur, POC: NEGATIVE
Spec Grav, UA: <=1.005 (ref 1.005–1.03)
Urobilinogen, UA: 0.2 (ref 0–1)
pH, UA: 6.5 (ref 5–8)

## 2014-07-11 MED ORDER — PHENAZOPYRIDINE HCL 200 MG PO TABS: 200.0000 mg | ORAL_TABLET | Freq: Three times a day (TID) | ORAL | Status: DC

## 2014-07-11 MED ORDER — NITROFURANTOIN MONOHYD MACRO 100 MG PO CAPS: 100.0000 mg | ORAL_CAPSULE | Freq: Two times a day (BID) | ORAL | Status: DC

## 2014-07-11 NOTE — ED Provider Notes (Signed)
CSN: [id number]     Arrival date & time 07/11/14  1316 History   First MD Initiated Contact with Patient 07/11/14 1431     Chief Complaint  Patient presents with  . Dysuria   (Consider location/radiation/quality/duration/timing/severity/associated sxs/prior Treatment) Patient is a 44 y.o. female presenting with dysuria. The history is provided by the patient. No language interpreter was used.  Dysuria Pain quality:  Aching Pain severity:  Moderate Onset quality:  Gradual Timing:  Constant Progression:  Worsening Chronicity:  New Recent urinary tract infections: no   Relieved by:  Nothing Worsened by:  Nothing tried Ineffective treatments:  None tried Urinary symptoms: no hematuria   Pt has a history of endometriosis.   Pt reports she has been having discomfort in her bladder area.  Pt reports she was recently traveling and holding her urine.  Pt reports now she has trouble trying to urinate.  Past Medical History  Diagnosis Date  . Endometriosis   . Tubal pregnancy   . Depression   . Allergy   . Asthma    Past Surgical History  Procedure Laterality Date  . Exploratory laparotomy      endometriosis  . Appendectomy    . Tonsillectomy    . Rhinoplasty     Family History  Problem Relation Age of Onset  . Obesity Mother   . Diabetes Mother   . Cirrhosis Father     Hep C, deceased after liver transplant  . Cerebral palsy Sister   . Drug abuse Brother   . Skin cancer Mother   . Hyperlipidemia Mother   . Psychosis Sister   . Bipolar disorder Brother    History  Substance Use Topics  . Smoking status: Never Smoker   . Smokeless tobacco: Not on file  . Alcohol Use: 8.4 oz/week    14 Glasses of wine per week   OB History    No data available     Review of Systems  Genitourinary: Positive for dysuria, urgency and decreased urine volume.  All other systems reviewed and are negative.   Allergies  Pineapple and Erythromycin  Home Medications   Prior to  Admission medications   Medication Sig Start Date End Date Taking? Authorizing Provider  albuterol (PROVENTIL HFA;VENTOLIN HFA) 108 (90 BASE) MCG/ACT inhaler Inhale 2 puffs into the lungs every 6 (six) hours as needed. 03/14/13   Hali Marry, MD  buPROPion (WELLBUTRIN XL) 300 MG 24 hr tablet TAKE 1 TABLET BY MOUTH EVERY MORNING 07/03/14   Hali Marry, MD  EPINEPHrine (EPIPEN 2-PAK) 0.3 mg/0.3 mL IJ SOAJ injection Inject 0.3 mLs (0.3 mg total) into the muscle once. 02/15/14   Hali Marry, MD  escitalopram (LEXAPRO) 10 MG tablet Take 1 tablet (10 mg total) by mouth daily. 04/05/14   Hali Marry, MD  Linaclotide [facility]) 145 MCG CAPS capsule Take 1 capsule (145 mcg total) by mouth daily. 04/04/14   Milus Banister, MD  nitrofurantoin, macrocrystal-monohydrate, (MACROBID) 100 MG capsule Take 1 capsule (100 mg total) by mouth 2 (two) times daily. 07/11/14   Fransico Meadow, PA-C  Norethindrone-Ethinyl Estradiol-Fe Biphas (LO LOESTRIN FE) 1 MG-10 MCG / 10 MCG tablet TAKE ONE TABLET BY MOUTH ONE TIME DAILY SKIPPING PLACEBO PILLS AS DIRECTED. 04/05/14   Hali Marry, MD  phenazopyridine (PYRIDIUM) 200 MG tablet Take 1 tablet (200 mg total) by mouth 3 (three) times daily. 07/11/14   Fransico Meadow, PA-C   BP 111/75 mmHg  Pulse 72  Temp(Src) 98.2 F (36.8 C) (Oral)  Resp 16  Ht 5\' 3"  (1.6 m)  Wt 131 lb (59.421 kg)  BMI 23.21 kg/m2  SpO2 99% Physical Exam  Constitutional: She appears well-developed and well-nourished.  HENT:  Head: Normocephalic and atraumatic.  Eyes: Pupils are equal, round, and reactive to light.  Cardiovascular: Normal rate.   Pulmonary/Chest: Effort normal.  Abdominal: Soft.  Musculoskeletal: Normal range of motion.  Neurological: She is alert.  Skin: Skin is warm.  Psychiatric: She has a normal mood and affect.  Nursing note and vitals reviewed.   ED Course  Procedures (including critical care time) Labs Review Labs Reviewed  POCT  URINALYSIS DIP (MANUAL ENTRY) - Abnormal; Notable for the following:    Blood, UA trace-lysed (*)    All other components within normal limits  URINE CULTURE    Imaging Review No results found.   MDM  Pt advised she needs to schedule to see urologist.  Urine culture ordered.  I will treat with pyridium and macrobid.     1. Dysuria     AVS   Fransico Meadow, PA-C 07/11/14 1948

## 2014-07-11 NOTE — Discharge Instructions (Signed)

## 2014-07-11 NOTE — ED Notes (Signed)
Pt bladder spasms and pressure with some urgency x 9 days. Denies fever.

## 2014-07-13 ENCOUNTER — Telehealth: Payer: Self-pay | Admitting: Emergency Medicine

## 2014-07-13 LAB — URINE CULTURE
Colony Count: NO GROWTH
Organism ID, Bacteria: NO GROWTH

## 2014-07-31 ENCOUNTER — Other Ambulatory Visit: Payer: Self-pay | Admitting: Family Medicine

## 2014-08-01 NOTE — Telephone Encounter (Signed)
Pt will return clinic call to schedule appt, states she has been going to a lot of "urologist" for "bladder issues" and needs to check her calendar before she can schedule.

## 2014-08-01 NOTE — Telephone Encounter (Signed)
Ok to fill but please call her and have her schedule around end of August or early September for f/u Mood. Thank you.

## 2014-08-01 NOTE — Telephone Encounter (Signed)
Patient needs appointment with Provider for any further refills. 

## 2014-09-29 ENCOUNTER — Other Ambulatory Visit: Payer: Self-pay | Admitting: Family Medicine

## 2014-11-09 ENCOUNTER — Other Ambulatory Visit: Payer: Self-pay | Admitting: Family Medicine

## 2014-12-26 ENCOUNTER — Other Ambulatory Visit: Payer: Self-pay | Admitting: Family Medicine

## 2014-12-27 ENCOUNTER — Other Ambulatory Visit: Payer: Self-pay | Admitting: Family Medicine

## 2014-12-28 ENCOUNTER — Telehealth: Payer: Self-pay | Admitting: Family Medicine

## 2014-12-28 DIAGNOSIS — Z9189 Other specified personal risk factors, not elsewhere classified: Secondary | ICD-10-CM

## 2014-12-28 DIAGNOSIS — F32A Depression, unspecified: Secondary | ICD-10-CM

## 2014-12-28 DIAGNOSIS — F329 Major depressive disorder, single episode, unspecified: Secondary | ICD-10-CM

## 2014-12-28 DIAGNOSIS — K581 Irritable bowel syndrome with constipation: Secondary | ICD-10-CM

## 2014-12-28 NOTE — Telephone Encounter (Signed)
Pt called clinic to give PCP a head's up on her recent health concerns. Pt was diagnosed with Ovarian cancer and has been received Chemo treatments via Dr. Claiborne Billings at the [facility].   Pt states she was advised to get her PCP to write an order for a Dexa Scan due to the harsh effects of chemo. Pt also reports she requested a refill on her medication and was advised she needed an appt. Pt will finish her Chemo the end of January and would like to wait 3 weeks at least before entering the office due to her low immune system. I advised Pt her blood work would be due the end of February and if she would like we could place the order early so she could have that drawn at a chemo visit using her port rather than being stuck multiple times. Pt will contact us in January to get orders placed and will provide a fax number for the cancer center so they can do the lab work.   Pt also advised the only Rx's she is currently taking besides her chemo drugs are: Lexapro, Wellbutrin, and Linzess.   Will route to PCP for review and order for dexa scan. Pt advised it would be next week before PCP reviews, verbalized understanding.

## 2015-01-01 MED ORDER — LINACLOTIDE 145 MCG PO CAPS: 145.0000 ug | ORAL_CAPSULE | Freq: Every day | ORAL | Status: DC

## 2015-01-01 MED ORDER — BUPROPION HCL ER (XL) 300 MG PO TB24: 300.0000 mg | ORAL_TABLET | Freq: Every morning | ORAL | Status: DC

## 2015-01-01 MED ORDER — ESCITALOPRAM OXALATE 10 MG PO TABS: 10.0000 mg | ORAL_TABLET | Freq: Every day | ORAL | Status: DC

## 2015-01-01 NOTE — Telephone Encounter (Signed)
Please tell her I am so sorry and my heart goes out to her.  I refilled her meds and signed the DEXA order.  Please tell her I look forward to seeing her February.

## 2015-02-05 ENCOUNTER — Other Ambulatory Visit: Payer: Self-pay

## 2015-02-05 DIAGNOSIS — F32A Depression, unspecified: Secondary | ICD-10-CM

## 2015-02-05 DIAGNOSIS — F329 Major depressive disorder, single episode, unspecified: Secondary | ICD-10-CM

## 2015-02-05 MED ORDER — BUPROPION HCL ER (XL) 300 MG PO TB24: 300.0000 mg | ORAL_TABLET | Freq: Every morning | ORAL | Status: DC

## 2015-02-12 ENCOUNTER — Encounter: Payer: Self-pay | Admitting: Family Medicine

## 2015-02-12 DIAGNOSIS — C562 Malignant neoplasm of left ovary: Secondary | ICD-10-CM

## 2015-02-12 DIAGNOSIS — C561 Malignant neoplasm of right ovary: Secondary | ICD-10-CM | POA: Insufficient documentation

## 2015-02-12 DIAGNOSIS — C563 Malignant neoplasm of bilateral ovaries: Secondary | ICD-10-CM | POA: Insufficient documentation

## 2015-02-19 ENCOUNTER — Other Ambulatory Visit: Payer: Self-pay | Admitting: Family Medicine

## 2015-02-19 DIAGNOSIS — Z1231 Encounter for screening mammogram for malignant neoplasm of breast: Secondary | ICD-10-CM

## 2015-02-26 ENCOUNTER — Ambulatory Visit (INDEPENDENT_AMBULATORY_CARE_PROVIDER_SITE_OTHER): Payer: BLUE CROSS/BLUE SHIELD | Admitting: Physician Assistant

## 2015-02-26 ENCOUNTER — Encounter: Payer: Self-pay | Admitting: Physician Assistant

## 2015-02-26 VITALS — BP 107/60 | HR 82 | Wt 162.0 lb

## 2015-02-26 DIAGNOSIS — F32A Depression, unspecified: Secondary | ICD-10-CM

## 2015-02-26 DIAGNOSIS — K581 Irritable bowel syndrome with constipation: Secondary | ICD-10-CM | POA: Diagnosis not present

## 2015-02-26 DIAGNOSIS — Z0000 Encounter for general adult medical examination without abnormal findings: Secondary | ICD-10-CM | POA: Diagnosis not present

## 2015-02-26 DIAGNOSIS — F329 Major depressive disorder, single episode, unspecified: Secondary | ICD-10-CM

## 2015-02-26 MED ORDER — LINACLOTIDE 145 MCG PO CAPS: 145.0000 ug | ORAL_CAPSULE | Freq: Every day | ORAL | Status: DC

## 2015-02-26 MED ORDER — ESCITALOPRAM OXALATE 20 MG PO TABS: 20.0000 mg | ORAL_TABLET | Freq: Every day | ORAL | Status: DC

## 2015-02-26 MED ORDER — BIMATOPROST 0.03 % EX SOLN: 1.0000 "application " | Freq: Every day | CUTANEOUS | Status: AC

## 2015-02-26 MED ORDER — BUPROPION HCL ER (XL) 300 MG PO TB24: 300.0000 mg | ORAL_TABLET | Freq: Every morning | ORAL | Status: DC

## 2015-02-26 NOTE — Progress Notes (Addendum)
   Subjective:    Patient ID: Monique May, female    DOB: [birth year], 45 y.o.   MRN: [id number]  HPI  Patient is a 45 year old female presenting for a complete physical. Patient has a past medical history relevant for ovarian cancer. Patient states that she completed her last round of chemo three weeks ago and is feeling well. Patient states that she had an abnormal colonoscopy with findings of sessile serrated polyps. Patient's last mammogram was conducted in March of 2016. Patient states that her next mammogram is scheduled for March of this year. Patient had a complete hysterectomy. Patient states that she has had a flu shot. Additional concerns for today's visit include addressing ways to increase hair and eyelash growth. Patient denies nausea, vomiting, fever, and dysuria. Patient has mild right and left lower abdominal tenderness.   Review of Systems Please see HPI.     Objective:   Physical Exam  Constitutional: She is oriented to person, place, and time. She appears well-developed and well-nourished.  HENT:  Head: Normocephalic and atraumatic.  Right Ear: External ear normal.  Left Ear: External ear normal.  Patient's tympanic membranes are pearly with bony landmarks visible and without erythema bilaterally.   Patient's posterior pharynx is mildly erythematous with cobblestoning secondary to post nasal drip.   Patient's nasal turbinates are edematous.   Pt has soft fuzz over entire scalp.   Eyes: Conjunctivae and EOM are normal. Pupils are equal, round, and reactive to light.  Neck: Normal range of motion. Neck supple.  Cardiovascular: Normal rate, regular rhythm, normal heart sounds and intact distal pulses.  Exam reveals no friction rub.   No murmur heard. Pulmonary/Chest: Effort normal and breath sounds normal. No respiratory distress. She has no wheezes. She has no rales.  Abdominal: Soft. Bowel sounds are normal. She exhibits no distension and no mass. There is no rebound  and no guarding.  Patient has right and left lower quadrant tenderness, along surgical scar.   Musculoskeletal: Normal range of motion.  Lymphadenopathy:    She has no cervical adenopathy.  Neurological: She is alert and oriented to person, place, and time.  Skin: Skin is warm and dry.  Psychiatric: She has a normal mood and affect. Her behavior is normal. Judgment and thought content normal.      Assessment & Plan:   1. Sessile Serated Polyps: Patient has a history of sessile serrated polyps found during colonoscopy. Patient is recommended to have a colonoscopy every five years. Next colonoscopy is due for 2020.   2. Hair Growth/Eye Laban Emperor growth: Patient has concerns about increasing hair and eye lash growth. Patient was advised to continue using Biotin and to increase her folic acid consumption. Patient was also provided patient education regarding Latisse for eye lash growth and Rogaine for hair growth.     3. Depression:  Patient will continue being treated with Wellbutrin and and Lexapro for depression. Patient's dose of Lexapro was increased to 20 mg daily. She has ativan from oncologist that she uses as needed.   4. Irritable Bowl Syndrome: Patient will continue to be treated with Linzess.  5. Health Maintenance: Patient's complete metabolic panel and lipid panel will be conducted at her next physical in 2018.  In care everywhere last cmp done 02/14/15 by [facility] normal lipids and LFT's.

## 2015-02-26 NOTE — Patient Instructions (Signed)

## 2015-03-30 ENCOUNTER — Telehealth: Payer: Self-pay

## 2015-03-30 NOTE — Telephone Encounter (Signed)
Monique May states she is still feeling sad, trouble concentrating and memory problem. She would like her medication adjusted and then come in for a follow up. She is taking Lexapro and Wellbutrin. Please advise.

## 2015-03-30 NOTE — Telephone Encounter (Signed)
Patient advised of recommendations.  

## 2015-03-30 NOTE — Telephone Encounter (Signed)
She is already on the max dose of Lexapro and pretty close to the max dose on Wellbutrin so if she feels like her medications are not working effectively than I recommend that she schedule appointment as we may need to actually change her medication regimen around or consider referring her to therapy or counseling if she has some specific issues going on.

## 2015-04-02 ENCOUNTER — Ambulatory Visit (HOSPITAL_BASED_OUTPATIENT_CLINIC_OR_DEPARTMENT_OTHER)
Admission: RE | Admit: 2015-04-02 | Discharge: 2015-04-02 | Disposition: A | Discharge status: Home / Self Care | Payer: BLUE CROSS/BLUE SHIELD | Source: Ambulatory Visit | Attending: Family Medicine | Admitting: Family Medicine

## 2015-04-02 DIAGNOSIS — M8580 Other specified disorders of bone density and structure, unspecified site: Secondary | ICD-10-CM | POA: Diagnosis not present

## 2015-04-02 DIAGNOSIS — Z9189 Other specified personal risk factors, not elsewhere classified: Secondary | ICD-10-CM

## 2015-04-02 DIAGNOSIS — Z87898 Personal history of other specified conditions: Secondary | ICD-10-CM | POA: Insufficient documentation

## 2015-04-02 DIAGNOSIS — Z780 Asymptomatic menopausal state: Secondary | ICD-10-CM | POA: Diagnosis not present

## 2015-04-02 DIAGNOSIS — Z1231 Encounter for screening mammogram for malignant neoplasm of breast: Secondary | ICD-10-CM | POA: Insufficient documentation

## 2015-04-02 DIAGNOSIS — Z90710 Acquired absence of both cervix and uterus: Secondary | ICD-10-CM | POA: Insufficient documentation

## 2015-05-04 ENCOUNTER — Other Ambulatory Visit: Payer: Self-pay | Admitting: Family Medicine

## 2015-06-30 ENCOUNTER — Other Ambulatory Visit: Payer: Self-pay | Admitting: Physician Assistant

## 2015-11-01 ENCOUNTER — Other Ambulatory Visit: Payer: Self-pay | Admitting: Physician Assistant

## 2015-11-01 DIAGNOSIS — F329 Major depressive disorder, single episode, unspecified: Secondary | ICD-10-CM

## 2015-11-01 DIAGNOSIS — F32A Depression, unspecified: Secondary | ICD-10-CM

## 2016-01-13 ENCOUNTER — Emergency Department (INDEPENDENT_AMBULATORY_CARE_PROVIDER_SITE_OTHER): Payer: BLUE CROSS/BLUE SHIELD

## 2016-01-13 ENCOUNTER — Encounter: Payer: Self-pay | Admitting: Emergency Medicine

## 2016-01-13 ENCOUNTER — Emergency Department (INDEPENDENT_AMBULATORY_CARE_PROVIDER_SITE_OTHER)
Admission: EM | Admit: 2016-01-13 | Discharge: 2016-01-13 | Disposition: A | Discharge status: Home / Self Care | Payer: BLUE CROSS/BLUE SHIELD | Source: Home / Self Care | Attending: Family Medicine | Admitting: Family Medicine

## 2016-01-13 DIAGNOSIS — R079 Chest pain, unspecified: Secondary | ICD-10-CM

## 2016-01-13 DIAGNOSIS — R509 Fever, unspecified: Secondary | ICD-10-CM

## 2016-01-13 DIAGNOSIS — R05 Cough: Secondary | ICD-10-CM

## 2016-01-13 DIAGNOSIS — R531 Weakness: Secondary | ICD-10-CM | POA: Diagnosis not present

## 2016-01-13 DIAGNOSIS — J209 Acute bronchitis, unspecified: Secondary | ICD-10-CM

## 2016-01-13 DIAGNOSIS — Z8543 Personal history of malignant neoplasm of ovary: Secondary | ICD-10-CM

## 2016-01-13 MED ORDER — AMOXICILLIN 500 MG PO CAPS: 500.0000 mg | ORAL_CAPSULE | Freq: Three times a day (TID) | ORAL | 0 refills | Status: DC

## 2016-01-13 MED ORDER — ALBUTEROL SULFATE (2.5 MG/3ML) 0.083% IN NEBU: 2.5000 mg | INHALATION_SOLUTION | Freq: Four times a day (QID) | RESPIRATORY_TRACT | 12 refills | Status: AC | PRN

## 2016-01-13 MED ORDER — HYDROCOD POLST-CPM POLST ER 10-8 MG/5ML PO SUER: 5.0000 mL | Freq: Two times a day (BID) | ORAL | 0 refills | Status: DC

## 2016-01-13 MED ORDER — AMOXICILLIN 500 MG PO CAPS
500.0000 mg | ORAL_CAPSULE | Freq: Three times a day (TID) | ORAL | 0 refills | Status: DC
Start: 2016-01-13 — End: 2016-01-13

## 2016-01-13 NOTE — Discharge Instructions (Signed)
Return if any problems.

## 2016-01-13 NOTE — ED Triage Notes (Signed)
Patient reports 1 week of congestion, cough, fatigue, aches, sore throat. Took ibuprofen at 0930 today.

## 2016-01-13 NOTE — ED Provider Notes (Signed)
CSN: [id number]     Arrival date & time 01/13/16  1217 History   None    Chief Complaint  Patient presents with  . Nasal Congestion  . Cough  . Fatigue  . Sore Throat   (Consider location/radiation/quality/duration/timing/severity/associated sxs/prior Treatment) The history is provided by the patient. No language interpreter was used.  Cough  Cough characteristics:  Productive Sputum characteristics:  Nondescript Severity:  Moderate Onset quality:  Gradual Duration:  10 days Timing:  Constant Progression:  Worsening Chronicity:  New Smoker: no   Relieved by:  Nothing Ineffective treatments:  None tried Associated symptoms: no fever and no shortness of breath   Risk factors: no recent infection   Sore Throat  Pertinent negatives include no shortness of breath.  Pt complains of a cough and congestion.  Pt feels like she has bronchitis.  Pt has a history of asthma.  Pt is out of albuterol neb solution  Past Medical History:  Diagnosis Date  . Allergy   . Asthma   . Depression   . Endometriosis   . Tubal pregnancy    Past Surgical History:  Procedure Laterality Date  . ABDOMINAL HYSTERECTOMY    . APPENDECTOMY    . EXPLORATORY LAPAROTOMY     endometriosis  . RHINOPLASTY    . TONSILLECTOMY     Family History  Problem Relation Age of Onset  . Obesity Mother   . Diabetes Mother   . Skin cancer Mother   . Hyperlipidemia Mother   . Cirrhosis Father     Hep C, deceased after liver transplant  . Cerebral palsy Sister   . Drug abuse Brother   . Psychosis Sister   . Bipolar disorder Brother    Social History  Substance Use Topics  . Smoking status: Never Smoker  . Smokeless tobacco: Never Used  . Alcohol use 8.4 oz/week    14 Glasses of wine per week   OB History    No data available     Review of Systems  Constitutional: Negative for fever.  Respiratory: Positive for cough. Negative for shortness of breath.   All other systems reviewed and are  negative.   Allergies  Pineapple and Erythromycin  Home Medications   Prior to Admission medications   Medication Sig Start Date End Date Taking? Authorizing Provider  albuterol (PROVENTIL) (2.5 MG/3ML) 0.083% nebulizer solution Take 3 mLs (2.5 mg total) by nebulization every 6 (six) hours as needed for wheezing or shortness of breath. 01/13/16   Fransico Meadow, PA-C  amoxicillin (AMOXIL) 500 MG capsule Take 1 capsule (500 mg total) by mouth 3 (three) times daily. 01/13/16   Fransico Meadow, PA-C  bimatoprost (LATISSE) 0.03 % ophthalmic solution Place 1 application into both eyes at bedtime. Place one drop on applicator and apply evenly along the skin of the upper eyelid at base of eyelashes once daily at bedtime; repeat procedure for second eye (use a clean applicator). 02/26/15   Jade L Breeback, PA-C  buPROPion (WELLBUTRIN XL) 300 MG 24 hr tablet Take 1 tablet (300 mg total) by mouth every morning. Must make appointment for future refills. 11/01/15   Jade L Breeback, PA-C  chlorpheniramine-HYDROcodone (TUSSIONEX PENNKINETIC ER) 10-8 MG/5ML SUER Take 5 mLs by mouth 2 (two) times daily. 01/13/16   Fransico Meadow, PA-C  EPINEPHrine (EPIPEN 2-PAK) 0.3 mg/0.3 mL IJ SOAJ injection Inject 0.3 mLs (0.3 mg total) into the muscle once. Patient not taking: Reported on 02/26/2015 02/15/14   Rene Kocher  [place], MD  escitalopram (LEXAPRO) 20 MG tablet TAKE 1 TABLET (20 MG TOTAL) BY MOUTH DAILY. 07/03/15   Jade L Breeback, PA-C  Linaclotide (LINZESS) 145 MCG CAPS capsule Take 1 capsule (145 mcg total) by mouth daily. 02/26/15   Donella Stade, PA-C   Meds Ordered and Administered this Visit  Medications - No data to display  BP 119/80 (BP Location: Left Arm)   Pulse 76   Temp 98 F (36.7 C) (Oral)   Resp 16   Ht 5' 2.5" (1.588 m)   Wt 131 lb (59.4 kg)   LMP 07/22/2011   SpO2 100%   BMI 23.58 kg/m  No data found.   Physical Exam  Constitutional: She appears well-developed and well-nourished. No  distress.  HENT:  Head: Normocephalic and atraumatic.  Right Ear: External ear normal.  Left Ear: External ear normal.  Mouth/Throat: Oropharynx is clear and moist.  Eyes: Conjunctivae are normal.  Neck: Neck supple.  Cardiovascular: Normal rate and regular rhythm.   No murmur heard. Pulmonary/Chest: Effort normal and breath sounds normal. No respiratory distress.  Abdominal: Soft. There is no tenderness.  Musculoskeletal: She exhibits no edema.  Neurological: She is alert.  Skin: Skin is warm and dry.  Psychiatric: She has a normal mood and affect.  Nursing note and vitals reviewed.   Urgent Care Course   Clinical Course     Procedures (including critical care time)  Labs Review Labs Reviewed - No data to display  Imaging Review Dg Chest 2 View  Result Date: 01/13/2016 CLINICAL DATA:  46 year old presenting with a 10 day history of low-grade fever, cough, chest congestion, generalized weakness, chills and left-sided chest pain. Personal history of ovarian cancer. Current history of asthma. EXAM: CHEST  2 VIEW COMPARISON:  12/07/2011. FINDINGS: Cardiomediastinal silhouette unremarkable, unchanged. Lungs clear. Bronchovascular markings normal. Pulmonary vascularity normal. No visible pleural effusions. No pneumothorax. Visualized bony thorax intact. No interval change. IMPRESSION: Normal examination. Electronically Signed   By: Evangeline Dakin M.D.   On: 01/13/2016 13:05     Visual Acuity Review  Right Eye Distance:   Left Eye Distance:   Bilateral Distance:    Right Eye Near:   Left Eye Near:    Bilateral Near:         MDM  Pt has a history of ovarain cancer,  Prolonged cough,  Chest xray obtained,  No pneumonia.  I will treat due to length of illness.  Pt given neb soluation and tussionex for cough at night.  She is having rib pain from coughins   1. Acute bronchitis, unspecified organism    Meds ordered this encounter  Medications  . albuterol (PROVENTIL)  (2.5 MG/3ML) 0.083% nebulizer solution    Sig: Take 3 mLs (2.5 mg total) by nebulization every 6 (six) hours as needed for wheezing or shortness of breath.    Dispense:  75 mL    Refill:  12    Order Specific Question:   Supervising Provider    Answer:   Theone Murdoch A [[id number]]  . DISCONTD: amoxicillin (AMOXIL) 500 MG capsule    Sig: Take 1 capsule (500 mg total) by mouth 3 (three) times daily.    Dispense:  30 capsule    Refill:  0    Order Specific Question:   Supervising Provider    Answer:   Assunta Found, STEPHEN A [[id number]]  . chlorpheniramine-HYDROcodone (TUSSIONEX PENNKINETIC ER) 10-8 MG/5ML SUER    Sig: Take 5 mLs by mouth 2 (two)  times daily.    Dispense:  70 mL    Refill:  0    Order Specific Question:   Supervising Provider    Answer:   Assunta Found, STEPHEN A [[id number]]  . amoxicillin (AMOXIL) 500 MG capsule    Sig: Take 1 capsule (500 mg total) by mouth 3 (three) times daily.    Dispense:  30 capsule    Refill:  0    Order Specific Question:   Supervising Provider    Answer:   Theone Murdoch A [[id number]]  An After Visit Summary was printed and given to the patient.    [place], PA-C 01/13/16 1349

## 2016-01-15 ENCOUNTER — Telehealth: Payer: Self-pay | Admitting: *Deleted

## 2016-01-15 MED ORDER — BENZONATATE 100 MG PO CAPS: 200.0000 mg | ORAL_CAPSULE | Freq: Three times a day (TID) | ORAL | 0 refills | Status: DC | PRN

## 2016-01-15 NOTE — Telephone Encounter (Signed)
Patient called reporting cough medication prescribed makes her drowsy during the day. Robitussin and Dayquil is not providing relief.

## 2016-01-30 ENCOUNTER — Other Ambulatory Visit: Payer: Self-pay | Admitting: Physician Assistant

## 2016-01-30 DIAGNOSIS — K581 Irritable bowel syndrome with constipation: Secondary | ICD-10-CM

## 2016-07-23 ENCOUNTER — Other Ambulatory Visit: Payer: Self-pay | Admitting: Family Medicine

## 2016-07-23 DIAGNOSIS — K581 Irritable bowel syndrome with constipation: Secondary | ICD-10-CM

## 2016-08-24 ENCOUNTER — Other Ambulatory Visit: Payer: Self-pay | Admitting: Family Medicine

## 2016-08-24 DIAGNOSIS — K581 Irritable bowel syndrome with constipation: Secondary | ICD-10-CM

## 2016-09-01 ENCOUNTER — Other Ambulatory Visit: Payer: Self-pay | Admitting: *Deleted

## 2017-01-20 ENCOUNTER — Other Ambulatory Visit: Payer: Self-pay | Admitting: Family Medicine

## 2017-01-20 DIAGNOSIS — K581 Irritable bowel syndrome with constipation: Secondary | ICD-10-CM

## 2017-09-24 ENCOUNTER — Emergency Department (INDEPENDENT_AMBULATORY_CARE_PROVIDER_SITE_OTHER)
Admission: EM | Admit: 2017-09-24 | Discharge: 2017-09-24 | Disposition: A | Discharge status: Home / Self Care | Payer: BLUE CROSS/BLUE SHIELD | Source: Home / Self Care | Attending: Family Medicine | Admitting: Family Medicine

## 2017-09-24 ENCOUNTER — Encounter: Payer: Self-pay | Admitting: *Deleted

## 2017-09-24 ENCOUNTER — Emergency Department (INDEPENDENT_AMBULATORY_CARE_PROVIDER_SITE_OTHER): Payer: BLUE CROSS/BLUE SHIELD

## 2017-09-24 DIAGNOSIS — K5900 Constipation, unspecified: Secondary | ICD-10-CM

## 2017-09-24 DIAGNOSIS — K581 Irritable bowel syndrome with constipation: Secondary | ICD-10-CM

## 2017-09-24 DIAGNOSIS — R1032 Left lower quadrant pain: Secondary | ICD-10-CM

## 2017-09-24 HISTORY — DX: Malignant (primary) neoplasm, unspecified: C80.1

## 2017-09-24 LAB — POCT URINALYSIS DIP (MANUAL ENTRY)
Bilirubin, UA: NEGATIVE
Glucose, UA: NEGATIVE mg/dL
Ketones, POC UA: NEGATIVE mg/dL
Leukocytes, UA: NEGATIVE
NITRITE UA: NEGATIVE
PH UA: 6 (ref 5.0–8.0)
PROTEIN UA: NEGATIVE mg/dL
Spec Grav, UA: <=1.005 — AB (ref 1.010–1.025)
Urobilinogen, UA: 0.2 E.U./dL

## 2017-09-24 LAB — POCT CBC W AUTO DIFF (K'VILLE URGENT CARE)

## 2017-09-24 NOTE — Discharge Instructions (Addendum)
For now, recommend continuing present dose of Linzess.  Try adding Miralax, 1/2 capful mixed in 8 oz water daily for about 5 to 7 days, then increase to a whole capful if necessary.

## 2017-09-24 NOTE — ED Triage Notes (Signed)
Gradual onset LLQ abd pain over past 4 months. Worse this morning. Denies N/V/D. Hx of IBS, and ovarian cancer. Has seen her PCP and oncologist r/t this pain.

## 2017-09-24 NOTE — ED Provider Notes (Signed)
[facility]    CSN: [id number] Arrival date & time: 09/24/17  0834     History   Chief Complaint Chief Complaint  Patient presents with  . Abdominal Pain    HPI Monique May is a 47 y.o. female.   Patient complains of approximately 4 month history of gradually increasing intermittent left lower quadrant abdominal pain.  During the past two weeks the pain has increased with abdominal bloating and discomfort with activities such as walking.  The pain does not radiate.  She denies urinary symptoms, nausea/vomiting, and fevers, chills, and sweats.  She has IBS for which she takes Linzess, but has had no recent change in bowel movements.  She does have occasional scant hematochezia, but admits that she has hemorrhoids and this is not a new change.  She reports that she has a follow-up appointment with her gastroenterologist in October.  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  She had seen her GYN oncologist Dr. Rayford Halsted on 09/18/17 who recommended continued close surveillance for now, and consider possible pelvic MRI. Patient reports that she had colonoscopy 4 years ago that revealed diverticulosis and multiple polyps (benign on biopsy).  It was recommended at that time that she repeat colonoscopy in 5 years.  The history is provided by the patient.  Abdominal Pain  Pain location:  LLQ Pain quality: aching, bloating and stabbing   Pain radiates to:  Does not radiate Pain severity:  Moderate Onset quality:  Gradual Duration:  16 weeks Timing:  Constant Progression:  Worsening Chronicity:  New Context: awakening from sleep and previous surgery   Context: not eating   Relieved by:  Nothing Worsened by:  Movement and position changes Ineffective treatments:  None tried Associated symptoms: constipation and hematochezia   Associated symptoms: no anorexia, no  belching, no chest pain, no chills, no cough, no diarrhea, no dysuria, no fatigue, no fever, no hematemesis, no hematuria, no melena, no nausea, no shortness of breath, no vaginal bleeding, no vaginal discharge and no vomiting     Past Medical History:  Diagnosis Date  . Allergy   . Asthma   . Cancer ([place])   . Depression   . Endometriosis   . Tubal pregnancy     Patient Active Problem List   Diagnosis Date Noted  . Osteopenia 04/02/2015  . Ovarian cancer, bilateral ([place]) 02/12/2015  . Left ankle pain 12/23/2012  . Migraine with aura 01/29/2012  . Depression 11/24/2011  . ALLERGIC ASTHMA 10/09/2009  . IRRITABLE BOWEL SYNDROME 03/13/2009  . ROTATOR CUFF SYNDROME, RIGHT 06/09/2008  . KERATOSIS PILARIS 06/09/2008    Past Surgical History:  Procedure Laterality Date  . ABDOMINAL HYSTERECTOMY    . APPENDECTOMY    . EXPLORATORY LAPAROTOMY     endometriosis  . RHINOPLASTY    . TONSILLECTOMY      OB History   None      Home Medications    Prior to Admission medications   Medication Sig Start Date End Date Taking? Authorizing Provider  albuterol (PROVENTIL) (2.5 MG/3ML) 0.083% nebulizer solution Take 3 mLs (2.5 mg total) by nebulization every 6 (six) hours as needed for wheezing or shortness of breath. 01/13/16   Fransico Meadow, PA-C  bimatoprost (LATISSE) 0.03 % ophthalmic solution Place 1 application into both eyes at bedtime. Place one drop on applicator and apply evenly along the skin of the upper eyelid at base of  eyelashes once daily at bedtime; repeat procedure for second eye (use a clean applicator). 02/26/15   Breeback, Jade L, PA-C  buPROPion (WELLBUTRIN XL) 300 MG 24 hr tablet Take 1 tablet (300 mg total) by mouth every morning. Must make appointment for future refills. 11/01/15   Breeback, Jade L, PA-C  EPINEPHrine (EPIPEN 2-PAK) 0.3 mg/0.3 mL IJ SOAJ injection Inject 0.3 mLs (0.3 mg total) into the muscle once. Patient not taking: Reported on 02/26/2015 02/15/14    Hali Marry, MD  escitalopram (LEXAPRO) 20 MG tablet TAKE 1 TABLET (20 MG TOTAL) BY MOUTH DAILY. 07/03/15   Breeback, Jade L, PA-C  LINZESS 145 MCG CAPS capsule TAKE 1 CAPSULE (145 MCG TOTAL) BY MOUTH DAILY. 01/20/17   Hali Marry, MD    Family History Family History  Problem Relation Age of Onset  . Obesity Mother   . Diabetes Mother   . Skin cancer Mother   . Hyperlipidemia Mother   . Cirrhosis Father        Hep C, deceased after liver transplant  . Cerebral palsy Sister   . Drug abuse Brother   . Psychosis Sister   . Bipolar disorder Brother     Social History Social History   Tobacco Use  . Smoking status: Never Smoker  . Smokeless tobacco: Never Used  Substance Use Topics  . Alcohol use: Yes    Alcohol/week: 14.0 standard drinks    Types: 14 Glasses of wine per week  . Drug use: Not on file     Allergies   Pineapple and Erythromycin   Review of Systems Review of Systems  Constitutional: Negative for appetite change, chills, fatigue and fever.  HENT: Negative.   Eyes: Negative.   Respiratory: Negative for cough and shortness of breath.   Cardiovascular: Negative for chest pain.  Gastrointestinal: Positive for abdominal pain, blood in stool, constipation and hematochezia. Negative for anorexia, diarrhea, hematemesis, melena, nausea and vomiting.  Genitourinary: Negative for dysuria, hematuria, vaginal bleeding and vaginal discharge.  Musculoskeletal: Negative.   Skin: Negative.   Neurological: Negative for headaches.     Physical Exam Triage Vital Signs ED Triage Vitals  Enc Vitals Group     BP 09/24/17 0857 113/77     Pulse Rate 09/24/17 0857 77     Resp 09/24/17 0857 16     Temp 09/24/17 0857 97.7 F (36.5 C)     Temp Source 09/24/17 0857 Oral     SpO2 09/24/17 0857 100 %     Weight 09/24/17 0858 133 lb (60.3 kg)     Height 09/24/17 0858 5\' 3"  (1.6 m)     Head Circumference --      Peak Flow --      Pain Score 09/24/17 0858 3       Pain Loc --      Pain Edu? --      Excl. in [place]? --    No data found.  Updated Vital Signs BP 113/77 (BP Location: Right Arm)   Pulse 77   Temp 97.7 F (36.5 C) (Oral)   Resp 16   Ht 5\' 3"  (1.6 m)   Wt 60.3 kg   LMP 07/22/2011   SpO2 100%   BMI 23.56 kg/m   Visual Acuity Right Eye Distance:   Left Eye Distance:   Bilateral Distance:    Right Eye Near:   Left Eye Near:    Bilateral Near:     Physical Exam  Constitutional: She appears  well-developed and well-nourished. She does not appear ill.  HENT:  Head: Normocephalic.  Right Ear: External ear normal.  Left Ear: External ear normal.  Nose: Nose normal.  Mouth/Throat: Oropharynx is clear and moist.  Eyes: Pupils are equal, round, and reactive to light. Conjunctivae are normal.  Neck: Neck supple.  Cardiovascular: Normal heart sounds.  Pulmonary/Chest: Breath sounds normal.  Abdominal: Soft. Normal appearance. Bowel sounds are decreased. There is no hepatosplenomegaly. There is tenderness in the left lower quadrant. There is no rigidity, no guarding and no CVA tenderness.    Musculoskeletal: She exhibits no edema.  Lymphadenopathy:    She has no cervical adenopathy.  Neurological: She is alert.  Skin: Skin is warm and dry. No rash noted.  Nursing note and vitals reviewed.    UC Treatments / Results  Labs (all labs ordered are listed, but only abnormal results are displayed) Labs Reviewed  POCT URINALYSIS DIP (MANUAL ENTRY) - Abnormal; Notable for the following components:      Result Value   Spec Grav, UA <=1.005 (*)    Blood, UA trace-intact (*)    All other components within normal limits  POCT CBC W AUTO DIFF ([facility]):  WBC 5.7; LY 26.5; MO 6.5; GR 67.0; Hgb 13.4; Platelets 259        EKG None  Radiology Dg Abdomen 1 View  Result Date: 09/24/2017 CLINICAL DATA:  Patient with left lower quadrant abdominal pain. EXAM: ABDOMEN - 1 VIEW COMPARISON:  None. FINDINGS: Relative paucity  of small bowel gas. Stool throughout the colon. Left lung bases clear. Unremarkable osseous structures. IMPRESSION: Stool throughout the colon as can be seen with constipation. Paucity of small bowel gas without evidence for obstruction. Electronically Signed   By: Lovey Newcomer M.D.   On: 09/24/2017 09:54    Procedures Procedures (including critical care time)  Medications Ordered in UC Medications - No data to display  Initial Impression / Assessment and Plan / UC Course  I have reviewed the triage vital signs and the nursing notes.  Pertinent labs & imaging results that were available during my care of the patient were reviewed by me and considered in my medical decision making (see chart for details).    Review of chart records reveal normal TSH 01/05/17. Diverticulitis unlikely with normal WBC.  KUB shows constipation and unremarkable gas pattern.  Patient may benefit from full dose of Linzess 290mg  daily, but for now recommend continuing 145mg  daily and trying Miralax.  Recommend follow-up with PCP in about 2 weeks, and gastroenterologist in October as scheduled.   Final Clinical Impressions(s) / UC Diagnoses   Final diagnoses:  Irritable bowel syndrome with constipation  Abdominal pain, left lower quadrant     Discharge Instructions     For now, recommend continuing present dose of Linzess.  Try adding Miralax, 1/2 capful mixed in 8 oz water daily for about 5 to 7 days, then increase to a whole capful if necessary.    ED Prescriptions    None        Kandra Nicolas, MD 09/27/17 2157

## 2019-08-18 IMAGING — DX DG ABDOMEN 1V
2 series · 2 of 2 positions shown · non-contrast
Comparison: None.

CLINICAL DATA: Patient with left lower quadrant abdominal pain.

EXAM:
ABDOMEN - 1 VIEW

[abdomen kub (1 of 2)]
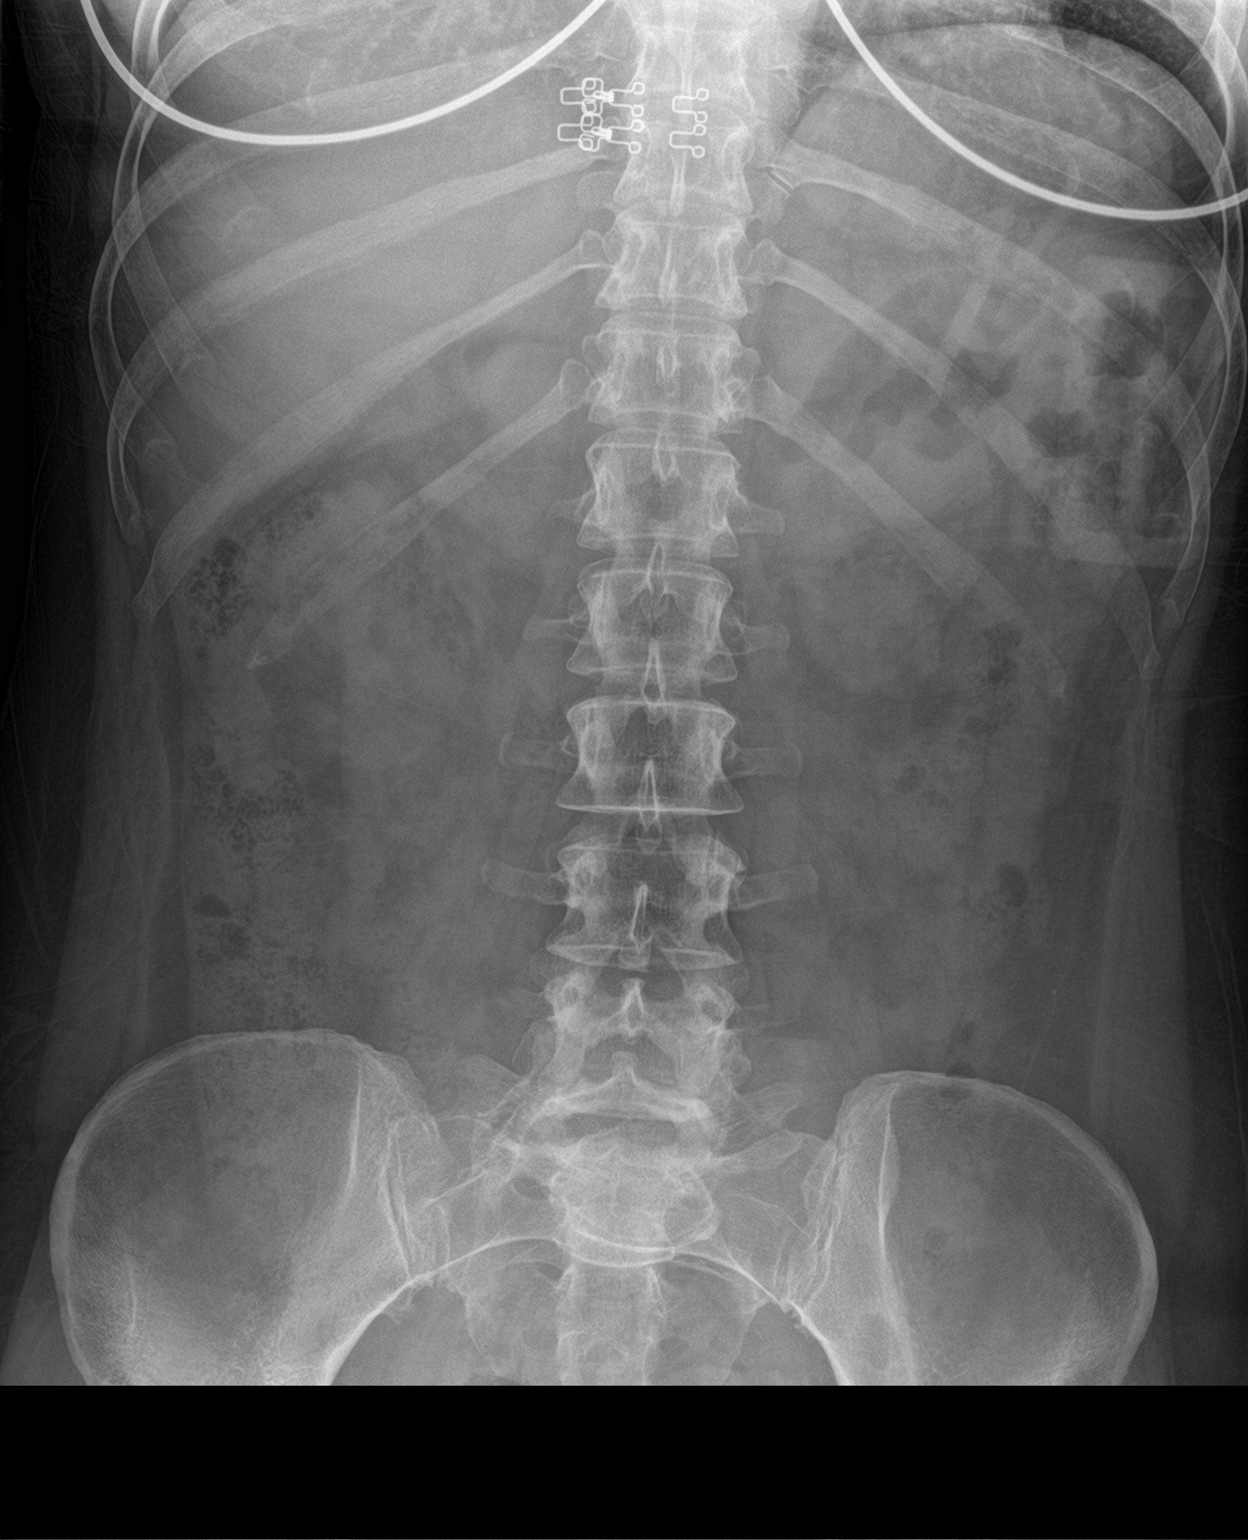

[abdomen kub (2 of 2)]
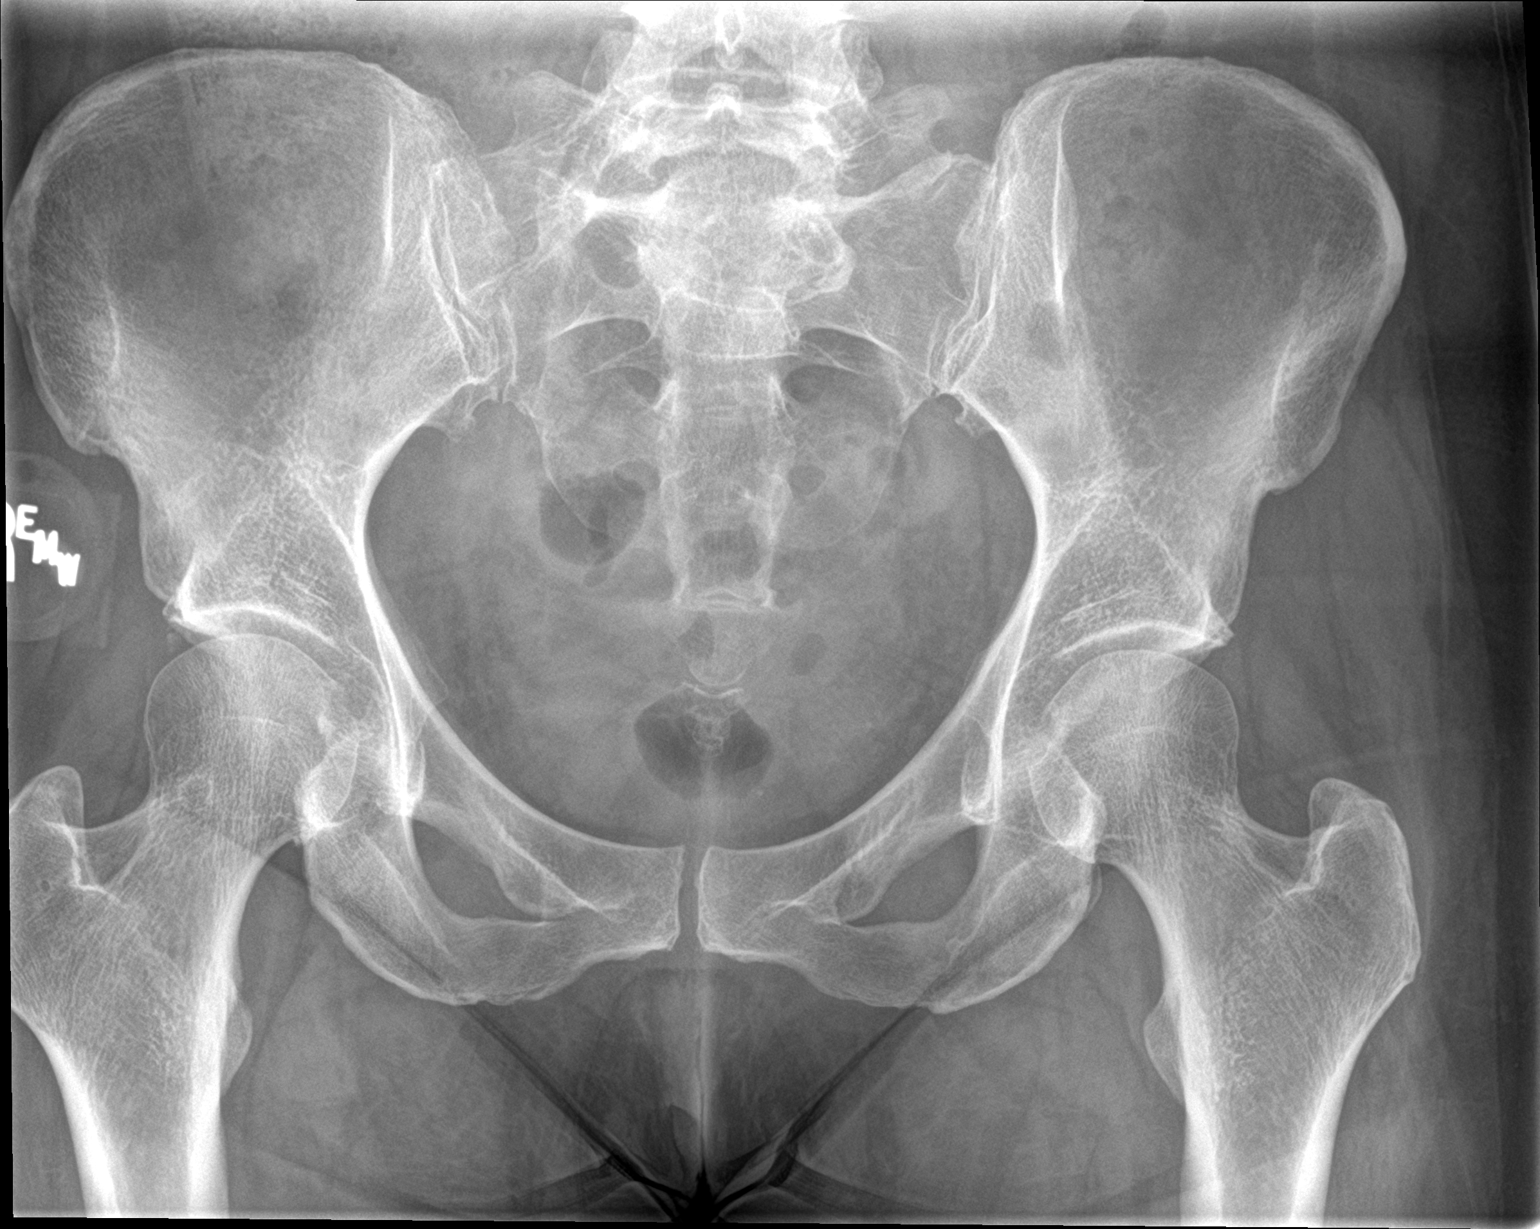

[2 of 2 positions shown; findings below may reference images not displayed]

FINDINGS: Relative paucity of small bowel gas. Stool throughout the colon.
Left lung bases clear. Unremarkable osseous structures.
IMPRESSION: Stool throughout the colon as can be seen with constipation.

Paucity of small bowel gas without evidence for obstruction.

## 2020-02-07 DEATH — deceased
